# Patient Record
Sex: Female | Born: 1965 | Race: White | Hispanic: No | Marital: Married | Smoking: Former smoker
Health system: Southern US, Community
[De-identification: ages and names within clinical notes are randomized; demographics above are authoritative.]

## PROBLEM LIST (undated history)

## (undated) DIAGNOSIS — F32A Depression, unspecified: Secondary | ICD-10-CM

## (undated) DIAGNOSIS — E063 Autoimmune thyroiditis: Secondary | ICD-10-CM

## (undated) DIAGNOSIS — F419 Anxiety disorder, unspecified: Secondary | ICD-10-CM

## (undated) DIAGNOSIS — N200 Calculus of kidney: Secondary | ICD-10-CM

## (undated) DIAGNOSIS — K3580 Unspecified acute appendicitis: Secondary | ICD-10-CM

## (undated) DIAGNOSIS — K219 Gastro-esophageal reflux disease without esophagitis: Secondary | ICD-10-CM

## (undated) DIAGNOSIS — F329 Major depressive disorder, single episode, unspecified: Secondary | ICD-10-CM

## (undated) HISTORY — DX: Anxiety disorder, unspecified: F41.9

## (undated) HISTORY — DX: Major depressive disorder, single episode, unspecified: F32.9

## (undated) HISTORY — DX: Unspecified acute appendicitis: K35.80

## (undated) HISTORY — DX: Depression, unspecified: F32.A

## (undated) HISTORY — DX: Autoimmune thyroiditis: E06.3

## (undated) HISTORY — PX: BUNIONECTOMY: SHX129

---

## 2015-02-05 DIAGNOSIS — F329 Major depressive disorder, single episode, unspecified: Secondary | ICD-10-CM | POA: Insufficient documentation

## 2015-02-05 DIAGNOSIS — F324 Major depressive disorder, single episode, in partial remission: Secondary | ICD-10-CM | POA: Insufficient documentation

## 2015-02-05 DIAGNOSIS — F32A Depression, unspecified: Secondary | ICD-10-CM | POA: Insufficient documentation

## 2017-08-27 DIAGNOSIS — F411 Generalized anxiety disorder: Secondary | ICD-10-CM | POA: Diagnosis not present

## 2017-09-03 DIAGNOSIS — Z1231 Encounter for screening mammogram for malignant neoplasm of breast: Secondary | ICD-10-CM | POA: Diagnosis not present

## 2017-09-21 ENCOUNTER — Encounter: Payer: Self-pay | Admitting: Internal Medicine

## 2017-09-21 DIAGNOSIS — Z6831 Body mass index (BMI) 31.0-31.9, adult: Secondary | ICD-10-CM | POA: Insufficient documentation

## 2017-09-21 DIAGNOSIS — R7989 Other specified abnormal findings of blood chemistry: Secondary | ICD-10-CM | POA: Insufficient documentation

## 2017-09-21 DIAGNOSIS — Z87442 Personal history of urinary calculi: Secondary | ICD-10-CM | POA: Insufficient documentation

## 2017-09-21 DIAGNOSIS — E063 Autoimmune thyroiditis: Secondary | ICD-10-CM

## 2017-09-22 ENCOUNTER — Encounter: Payer: Self-pay | Admitting: Internal Medicine

## 2017-09-22 ENCOUNTER — Ambulatory Visit (INDEPENDENT_AMBULATORY_CARE_PROVIDER_SITE_OTHER): Payer: 59 | Admitting: Internal Medicine

## 2017-09-22 VITALS — BP 138/88 | HR 85 | Ht 62.0 in | Wt 174.0 lb

## 2017-09-22 DIAGNOSIS — E063 Autoimmune thyroiditis: Secondary | ICD-10-CM

## 2017-09-22 DIAGNOSIS — F32A Depression, unspecified: Secondary | ICD-10-CM

## 2017-09-22 DIAGNOSIS — F329 Major depressive disorder, single episode, unspecified: Secondary | ICD-10-CM | POA: Diagnosis not present

## 2017-09-22 DIAGNOSIS — F419 Anxiety disorder, unspecified: Secondary | ICD-10-CM

## 2017-09-22 NOTE — Progress Notes (Signed)
Date:  09/22/2017   Name:  Tamara Fox   DOB:  [birth year]   MRN:  [id number]   Chief Complaint: Establish Care (Needs PCP. ) and Hypothyroidism (Thyroid levels checked. )  Thyroid Problem  Presents for follow-up visit. Symptoms include depressed mood. Patient reports no fatigue, heat intolerance, leg swelling, menstrual problem, palpitations or weight gain. The symptoms have been stable.  Depression       The patient presents with depression.  This is a chronic problem.  The problem has been gradually improving since onset.  Associated symptoms include no fatigue and no suicidal ideas.  Past treatments include SSRIs - Selective serotonin reuptake inhibitors and other medications.  Compliance with treatment is good.  Past medical history includes thyroid problem, anxiety, depression and suicide attempts.   She has a therapist that she sees regularly and a Therapist, sports with [employer].    Review of Systems  Constitutional: Negative for chills, fatigue, fever and weight gain.  Respiratory: Negative for cough, chest tightness, shortness of breath and wheezing.   Cardiovascular: Negative for chest pain and palpitations.  Endocrine: Negative for heat intolerance.  Genitourinary: Negative for menstrual problem.  Allergic/Immunologic: Negative for environmental allergies.  Psychiatric/Behavioral: Positive for depression. Negative for suicidal ideas.    Patient Active Problem List   Diagnosis Date Noted  . Family history of heart attack 09/21/2017  . Family history of high cholesterol 09/21/2017  . Hashimoto's thyroiditis 09/21/2017  . History of bunionectomy of right great toe 09/21/2017  . History of renal stone 09/21/2017  . History of suicide attempt 09/21/2017  . Overweight 09/21/2017  . Anxiety and depression 02/05/2015    Prior to Admission medications   Medication Sig Start Date End Date Taking? Authorizing Provider  b complex vitamins capsule Take 1 capsule by mouth  daily.   Yes [provider]  busPIRone (BUSPAR) 10 MG tablet Take 10 mg by mouth 2 (two) times daily.   Yes [provider]  Cholecalciferol (VITAMIN D) 2000 units CAPS    Yes [provider]  DULoxetine (CYMBALTA) 60 MG capsule Take 60 mg by mouth daily.   Yes [provider]  etonogestrel-ethinyl estradiol (NUVARING) 0.12-0.015 MG/24HR vaginal ring Place 1 each vaginally every 28 (twenty-eight) days. Insert vaginally and leave in place for 3 consecutive weeks, then remove for 1 week.   Yes [provider]  glucosamine-chondroitin 500-400 MG tablet Take 1 tablet by mouth 3 (three) times daily.   Yes [provider]  hydrOXYzine (ATARAX/VISTARIL) 25 MG tablet Take 50 mg by mouth. 12/14/15  Yes [provider]  levothyroxine (SYNTHROID, LEVOTHROID) 125 MCG tablet levothyroxine 125 mcg tablet   Yes [provider]  Omega-3 Fatty Acids (FISH OIL PO) Take by mouth.   Yes [provider]  RaNITidine HCl (RANITIDINE 150 MAX STRENGTH PO) Take 150 mg by mouth daily.    Yes [provider]    No Known Allergies  Past Surgical History:  Procedure Laterality Date  . BUNIONECTOMY  2013 2017   RightX 2    Social History   Tobacco Use  . Smoking status: Former Smoker    Packs/day: 0.50    Years: 20.00    Pack years: 10.00    Last attempt to quit: 09/30/2002    Years since quitting: 14.9  . Smokeless tobacco: Never Used  Substance Use Topics  . Alcohol use: Yes    Comment: 2 times weekly  . Drug use: Never  Medication list has been reviewed and updated.  No flowsheet data found.  Physical Exam  Constitutional: She is oriented to person, place, and time. She appears well-developed. No distress.  HENT:  Head: Normocephalic and atraumatic.  Neck: Normal range of motion. Neck supple. No thyromegaly present.  Cardiovascular: Normal rate, regular rhythm and normal heart sounds.  Pulmonary/Chest:  Effort normal and breath sounds normal. No respiratory distress. She has no wheezes.  Musculoskeletal: Normal range of motion. She exhibits no edema or tenderness.  Neurological: She is alert and oriented to person, place, and time.  Skin: Skin is warm and dry. No rash noted.  Psychiatric: She has a normal mood and affect. Her speech is normal and behavior is normal. Thought content normal.  Nursing note and vitals reviewed.   BP 138/88   Pulse 85   Ht 5\' 2"  (1.575 m)   Wt 174 lb (78.9 kg)   LMP 09/08/2017 (Within Days)   SpO2 98%   BMI 31.83 kg/m   Assessment and Plan: 1. Hashimoto's disease Continue current medication - Thyroid Panel With TSH  2. Anxiety and depression Doing well - continue current medication and follow up with mental health providers   No orders of the defined types were placed in this encounter.   Partially dictated using Animal nutritionistDragon software. Any errors are unintentional.  Bari EdwardLaura Female Iafrate, MD [facility]  09/22/2017

## 2017-09-23 LAB — THYROID PANEL WITH TSH
FREE THYROXINE INDEX: 1.6 (ref 1.2–4.9)
T3 UPTAKE RATIO: 19 % — AB (ref 24–39)
T4, Total: 8.5 ug/dL (ref 4.5–12.0)
TSH: 1.36 u[IU]/mL (ref 0.450–4.500)

## 2017-12-18 ENCOUNTER — Encounter: Payer: Self-pay | Admitting: Internal Medicine

## 2017-12-22 ENCOUNTER — Other Ambulatory Visit: Payer: Self-pay | Admitting: Internal Medicine

## 2017-12-22 DIAGNOSIS — F32A Depression, unspecified: Secondary | ICD-10-CM

## 2017-12-22 DIAGNOSIS — F419 Anxiety disorder, unspecified: Principal | ICD-10-CM

## 2017-12-22 DIAGNOSIS — F329 Major depressive disorder, single episode, unspecified: Secondary | ICD-10-CM

## 2018-01-12 DIAGNOSIS — Z79899 Other long term (current) drug therapy: Secondary | ICD-10-CM | POA: Diagnosis not present

## 2018-01-12 DIAGNOSIS — F330 Major depressive disorder, recurrent, mild: Secondary | ICD-10-CM | POA: Diagnosis not present

## 2018-01-12 DIAGNOSIS — F411 Generalized anxiety disorder: Secondary | ICD-10-CM | POA: Diagnosis not present

## 2018-03-23 ENCOUNTER — Encounter: Payer: Self-pay | Admitting: Internal Medicine

## 2018-03-23 ENCOUNTER — Other Ambulatory Visit: Payer: Self-pay

## 2018-03-23 MED ORDER — LEVOTHYROXINE SODIUM 125 MCG PO TABS: ORAL_TABLET | ORAL | 0 refills | Status: DC

## 2018-03-29 ENCOUNTER — Encounter: Payer: Self-pay | Admitting: Internal Medicine

## 2018-03-30 ENCOUNTER — Other Ambulatory Visit (HOSPITAL_COMMUNITY)
Admission: RE | Admit: 2018-03-30 | Discharge: 2018-03-30 | Disposition: A | Discharge status: Home / Self Care | Payer: 59 | Source: Ambulatory Visit | Attending: Internal Medicine | Admitting: Internal Medicine

## 2018-03-30 ENCOUNTER — Encounter: Payer: Self-pay | Admitting: Internal Medicine

## 2018-03-30 ENCOUNTER — Ambulatory Visit (INDEPENDENT_AMBULATORY_CARE_PROVIDER_SITE_OTHER): Payer: 59 | Admitting: Internal Medicine

## 2018-03-30 ENCOUNTER — Other Ambulatory Visit: Payer: Self-pay

## 2018-03-30 VITALS — BP 128/84 | HR 73 | Ht 62.0 in | Wt 170.0 lb

## 2018-03-30 DIAGNOSIS — F324 Major depressive disorder, single episode, in partial remission: Secondary | ICD-10-CM | POA: Diagnosis not present

## 2018-03-30 DIAGNOSIS — E063 Autoimmune thyroiditis: Secondary | ICD-10-CM | POA: Diagnosis not present

## 2018-03-30 DIAGNOSIS — Z124 Encounter for screening for malignant neoplasm of cervix: Secondary | ICD-10-CM

## 2018-03-30 DIAGNOSIS — Z1239 Encounter for other screening for malignant neoplasm of breast: Secondary | ICD-10-CM

## 2018-03-30 DIAGNOSIS — Z1211 Encounter for screening for malignant neoplasm of colon: Secondary | ICD-10-CM

## 2018-03-30 DIAGNOSIS — Z0000 Encounter for general adult medical examination without abnormal findings: Secondary | ICD-10-CM

## 2018-03-30 LAB — POCT URINALYSIS DIPSTICK
Bilirubin, UA: NEGATIVE
Blood, UA: NEGATIVE
GLUCOSE UA: NEGATIVE
Ketones, UA: NEGATIVE
LEUKOCYTES UA: NEGATIVE
Nitrite, UA: NEGATIVE
Protein, UA: NEGATIVE
SPEC GRAV UA: 1.015 (ref 1.010–1.025)
Urobilinogen, UA: 0.2 E.U./dL
pH, UA: 6 (ref 5.0–8.0)

## 2018-03-30 NOTE — Progress Notes (Signed)
Date:  03/30/2018   Name:  Venie Montesinos   DOB:  07/02/1965   MRN:  [id number]   Chief Complaint: Annual Exam (Breast Exam.) Ezelle Surprenant is a 52 y.o. female who presents today for her Complete Annual Exam. She feels well. She reports exercising walking at work. She reports she is sleeping well. No breast issues.  Still having periods but very light.  Mild hot flashes.  Mammogram done in march.   Thyroid Problem  Presents for follow-up visit. Patient reports no anxiety, constipation, depressed mood, diarrhea, fatigue, palpitations, tremors, weight gain or weight loss. The symptoms have been stable.  Depression         This is a chronic problem.  Associated symptoms include no fatigue and no headaches.  Past treatments include SNRIs - Serotonin and norepinephrine reuptake inhibitors and other medications.  Compliance with treatment is good.  Previous treatment provided significant relief.  Past medical history includes thyroid problem.     Review of Systems  Constitutional: Negative for chills, fatigue, fever, weight gain and weight loss.  HENT: Negative for congestion, hearing loss, tinnitus, trouble swallowing and voice change.   Eyes: Negative for visual disturbance.  Respiratory: Negative for cough, chest tightness, shortness of breath and wheezing.   Cardiovascular: Negative for chest pain, palpitations and leg swelling.  Gastrointestinal: Negative for abdominal pain, constipation, diarrhea and vomiting.  Endocrine: Negative for polydipsia and polyuria.  Genitourinary: Negative for dysuria, frequency, genital sores and vaginal discharge.  Musculoskeletal: Negative for arthralgias, gait problem and joint swelling.  Skin: Negative for color change and rash.  Neurological: Negative for dizziness, tremors, light-headedness and headaches.  Hematological: Negative for adenopathy. Does not bruise/bleed easily.  Psychiatric/Behavioral: Positive for depression. Negative for dysphoric mood  and sleep disturbance. The patient is not nervous/anxious.     Patient Active Problem List   Diagnosis Date Noted  . High serum high density lipoprotein (HDL) 09/21/2017  . Hashimoto's thyroiditis 09/21/2017  . History of renal stone 09/21/2017  . BMI 31.0-31.9,adult 09/21/2017  . 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 BUNIONECTOMY  2013 2017   RightX 2    Social History   Tobacco Use  . Smoking status: Former Smoker    Packs/day: 0.50    Years: 20.00    Pack years: 10.00    Last attempt to quit: 09/30/2002    Years since quitting: 15.5  . Smokeless tobacco: Never Used  Substance Use Topics  . Alcohol use: Yes    Comment: 2 times weekly  . Drug use: Never     Medication list has been reviewed and updated.  Current Meds  Medication Sig  . b complex vitamins capsule Take 1 capsule by mouth daily.  . busPIRone (BUSPAR) 10 MG tablet Take 10 mg by mouth 2 (two) times daily.  . Cholecalciferol (VITAMIN D) 2000 units CAPS   . DULoxetine (CYMBALTA) 60 MG capsule Take 60 mg by mouth daily.  . hydrOXYzine (ATARAX/VISTARIL) 25 MG tablet Take 50 mg by mouth.  . levothyroxine (SYNTHROID, LEVOTHROID) 125 MCG tablet Take 1 tablet by mouth daily.  . Omega-3 Fatty Acids (FISH OIL PO) Take by mouth.  . RaNITidine HCl (RANITIDINE 150 MAX STRENGTH PO) Take 150 mg by mouth daily.     PHQ 2/9 Scores 03/30/2018  PHQ - 2 Score 0  PHQ- 9 Score 0    Physical Exam  Constitutional: She is oriented to person, place, and time. She appears well-developed and well-nourished. No distress.  HENT:  Head: Normocephalic and atraumatic.  Right Ear: Tympanic membrane and ear canal normal.  Left Ear: Tympanic membrane and ear canal normal.  Nose: Right sinus exhibits no maxillary sinus tenderness. Left sinus exhibits no maxillary sinus tenderness.  Mouth/Throat: Uvula is midline and  oropharynx is clear and moist.  Eyes: Conjunctivae and EOM are normal. Right eye exhibits no discharge. Left eye exhibits no discharge. No scleral icterus.  Neck: Normal range of motion. Carotid bruit is not present. No erythema present. No thyromegaly present.  Cardiovascular: Normal rate, regular rhythm, normal heart sounds and normal pulses.  Pulmonary/Chest: Effort normal. No respiratory distress. She has no wheezes. Right breast exhibits no mass, no nipple discharge, no skin change and no tenderness. Left breast exhibits no mass, no nipple discharge, no skin change and no tenderness.  Abdominal: Soft. Bowel sounds are normal. There is no hepatosplenomegaly. There is no tenderness. There is no CVA tenderness.  Genitourinary: Rectum normal and uterus normal. There is no rash, tenderness or lesion on the right labia. There is no rash, tenderness or lesion on the left labia. Cervix exhibits discharge. Cervix exhibits no motion tenderness and no friability. Right adnexum displays no mass, no tenderness and no fullness. Left adnexum displays no mass, no tenderness and no fullness.  Musculoskeletal: Normal range of motion.  Lymphadenopathy:    She has no cervical adenopathy.    She has no axillary adenopathy.  Neurological: She is alert and oriented to person, place, and time. She has normal reflexes. No cranial nerve deficit or sensory deficit.  Skin: Skin is warm, dry and intact. No rash noted.  Psychiatric: She has a normal mood and affect. Her speech is normal and behavior is normal. Thought content normal.  Nursing note and vitals reviewed.   BP (!) 142/82 (BP Location: Right Arm, Patient Position: Sitting, Cuff Size: Normal)   Pulse 73   Ht 5\' 2"  (1.575 m)   Wt 170 lb (77.1 kg)   LMP  (Exact Date) Comment: unkown  SpO2 100%   BMI 31.09 kg/m   Assessment and Plan: 1. Annual physical exam Normal exam except weight  Begin regular exercise - CBC with Differential/Platelet -  Comprehensive metabolic panel - Hemoglobin A1c - Lipid panel - POCT urinalysis dipstick  2. Breast cancer screening Pt to schedule in March  3. Papanicolaou smear for cervical cancer screening Obtained today - Cytology - PAP  4. Hashimoto's thyroiditis Check labs then refill meds - Thyroid Panel With TSH  5. Major depression single episode, in partial remission (HCC) Doing well on current therapy Followed by Psych  6. Colon cancer screening - Ambulatory referral to Gastroenterology   Partially dictated using Dragon software. Any errors are unintentional.  Bari Edward, MD [facility]  03/30/2018

## 2018-03-31 ENCOUNTER — Other Ambulatory Visit: Payer: Self-pay

## 2018-03-31 ENCOUNTER — Encounter: Payer: Self-pay | Admitting: Internal Medicine

## 2018-03-31 LAB — HEMOGLOBIN A1C
Est. average glucose Bld gHb Est-mCnc: 91 mg/dL
HEMOGLOBIN A1C: 4.8 % (ref 4.8–5.6)

## 2018-03-31 LAB — COMPREHENSIVE METABOLIC PANEL
A/G RATIO: 2.3 — AB (ref 1.2–2.2)
ALK PHOS: 80 IU/L (ref 39–117)
ALT: 35 IU/L — AB (ref 0–32)
AST: 28 IU/L (ref 0–40)
Albumin: 4.9 g/dL (ref 3.5–5.5)
BILIRUBIN TOTAL: 0.4 mg/dL (ref 0.0–1.2)
BUN/Creatinine Ratio: 15 (ref 9–23)
BUN: 12 mg/dL (ref 6–24)
CHLORIDE: 98 mmol/L (ref 96–106)
CO2: 25 mmol/L (ref 20–29)
Calcium: 10 mg/dL (ref 8.7–10.2)
Creatinine, Ser: 0.8 mg/dL (ref 0.57–1.00)
GFR calc non Af Amer: 86 mL/min/{1.73_m2} (ref >59)
GFR, EST AFRICAN AMERICAN: 99 mL/min/{1.73_m2} (ref >59)
GLUCOSE: 70 mg/dL (ref 65–99)
Globulin, Total: 2.1 g/dL (ref 1.5–4.5)
POTASSIUM: 4.4 mmol/L (ref 3.5–5.2)
Sodium: 140 mmol/L (ref 134–144)
Total Protein: 7 g/dL (ref 6.0–8.5)

## 2018-03-31 LAB — CBC WITH DIFFERENTIAL/PLATELET
BASOS: 1 %
Basophils Absolute: 0.1 10*3/uL (ref 0.0–0.2)
EOS (ABSOLUTE): 0.1 10*3/uL (ref 0.0–0.4)
Eos: 2 %
HEMOGLOBIN: 14.1 g/dL (ref 11.1–15.9)
Hematocrit: 43 % (ref 34.0–46.6)
IMMATURE GRANS (ABS): 0 10*3/uL (ref 0.0–0.1)
Immature Granulocytes: 0 %
LYMPHS ABS: 2.5 10*3/uL (ref 0.7–3.1)
LYMPHS: 41 %
MCH: 30.2 pg (ref 26.6–33.0)
MCHC: 32.8 g/dL (ref 31.5–35.7)
MCV: 92 fL (ref 79–97)
MONOCYTES: 10 %
Monocytes Absolute: 0.6 10*3/uL (ref 0.1–0.9)
NEUTROS ABS: 2.8 10*3/uL (ref 1.4–7.0)
Neutrophils: 46 %
PLATELETS: 304 10*3/uL (ref 150–450)
RBC: 4.67 x10E6/uL (ref 3.77–5.28)
RDW: 12.2 % — AB (ref 12.3–15.4)
WBC: 6.1 10*3/uL (ref 3.4–10.8)

## 2018-03-31 LAB — LIPID PANEL
CHOL/HDL RATIO: 2.7 ratio (ref 0.0–4.4)
Cholesterol, Total: 207 mg/dL — ABNORMAL HIGH (ref 100–199)
HDL: 78 mg/dL (ref >39)
LDL CALC: 112 mg/dL — AB (ref 0–99)
TRIGLYCERIDES: 83 mg/dL (ref 0–149)
VLDL Cholesterol Cal: 17 mg/dL (ref 5–40)

## 2018-03-31 LAB — CYTOLOGY - PAP
Adequacy: ABSENT
Diagnosis: NEGATIVE

## 2018-03-31 LAB — THYROID PANEL WITH TSH
Free Thyroxine Index: 2.1 (ref 1.2–4.9)
T3 UPTAKE RATIO: 27 % (ref 24–39)
T4 TOTAL: 7.8 ug/dL (ref 4.5–12.0)
TSH: 0.558 u[IU]/mL (ref 0.450–4.500)

## 2018-03-31 MED ORDER — LEVOTHYROXINE SODIUM 125 MCG PO TABS: ORAL_TABLET | ORAL | 1 refills | Status: DC

## 2018-04-13 DIAGNOSIS — Z79899 Other long term (current) drug therapy: Secondary | ICD-10-CM | POA: Diagnosis not present

## 2018-04-13 DIAGNOSIS — F411 Generalized anxiety disorder: Secondary | ICD-10-CM | POA: Diagnosis not present

## 2018-04-13 DIAGNOSIS — F330 Major depressive disorder, recurrent, mild: Secondary | ICD-10-CM | POA: Diagnosis not present

## 2018-04-28 NOTE — Anesthesia Preprocedure Evaluation (Addendum)
Anesthesia Evaluation  Patient identified by MRN, date of birth, ID band Patient awake    Reviewed: Allergy & Precautions, NPO status , Patient's Chart, lab work & pertinent test results  History of Anesthesia Complications Negative for: history of anesthetic complications  Airway Mallampati: II  TM Distance: >3 FB Neck ROM: Full    Dental  (+)    Pulmonary former smoker (quit 2004),  Snoring    Pulmonary exam normal breath sounds clear to auscultation       Cardiovascular Exercise Tolerance: Good negative cardio ROS Normal cardiovascular exam Rhythm:Regular Rate:Normal     Neuro/Psych PSYCHIATRIC DISORDERS Anxiety Depression negative neurological ROS     GI/Hepatic GERD  ,  Endo/Other  Hypothyroidism (Hashimoto)   Renal/GU Renal disease (hx nephrolithiasis)     Musculoskeletal   Abdominal   Peds  Hematology negative hematology ROS (+)   Anesthesia Other Findings   Reproductive/Obstetrics                            Anesthesia Physical Anesthesia Plan  ASA: II  Anesthesia Plan: General   Post-op Pain Management:    Induction: Intravenous  PONV Risk Score and Plan: 3 and Propofol infusion and TIVA  Airway Management Planned: Natural Airway  Additional Equipment:   Intra-op Plan:   Post-operative Plan:   Informed Consent: I have reviewed the patients History and Physical, chart, labs and discussed the procedure including the risks, benefits and alternatives for the proposed anesthesia with the patient or authorized representative who has indicated his/her understanding and acceptance.     Plan Discussed with: CRNA  Anesthesia Plan Comments:        Anesthesia Quick Evaluation

## 2018-05-03 ENCOUNTER — Other Ambulatory Visit: Payer: Self-pay

## 2018-05-03 ENCOUNTER — Encounter: Payer: Self-pay | Admitting: *Deleted

## 2018-05-04 NOTE — Discharge Instructions (Signed)
General Anesthesia, Adult, Care After °These instructions provide you with information about caring for yourself after your procedure. Your health care provider may also give you more specific instructions. Your treatment has been planned according to current medical practices, but problems sometimes occur. Call your health care provider if you have any problems or questions after your procedure. °What can I expect after the procedure? °After the procedure, it is common to have: °· Vomiting. °· A sore throat. °· Mental slowness. ° °It is common to feel: °· Nauseous. °· Cold or shivery. °· Sleepy. °· Tired. °· Sore or achy, even in parts of your body where you did not have surgery. ° °Follow these instructions at home: °For at least 24 hours after the procedure: °· Do not: °? Participate in activities where you could fall or become injured. °? Drive. °? Use heavy machinery. °? Drink alcohol. °? Take sleeping pills or medicines that cause drowsiness. °? Make important decisions or sign legal documents. °? Take care of children on your own. °· Rest. °Eating and drinking °· If you vomit, drink water, juice, or soup when you can drink without vomiting. °· Drink enough fluid to keep your urine clear or pale yellow. °· Make sure you have little or no nausea before eating solid foods. °· Follow the diet recommended by your health care provider. °General instructions °· Have a responsible adult stay with you until you are awake and alert. °· Return to your normal activities as told by your health care provider. Ask your health care provider what activities are safe for you. °· Take over-the-counter and prescription medicines only as told by your health care provider. °· If you smoke, do not smoke without supervision. °· Keep all follow-up visits as told by your health care provider. This is important. °Contact a health care provider if: °· You continue to have nausea or vomiting at home, and medicines are not helpful. °· You  cannot drink fluids or start eating again. °· You cannot urinate after 8-12 hours. °· You develop a skin rash. °· You have fever. °· You have increasing redness at the site of your procedure. °Get help right away if: °· You have difficulty breathing. °· You have chest pain. °· You have unexpected bleeding. °· You feel that you are having a life-threatening or urgent problem. °This information is not intended to replace advice given to you by your health care provider. Make sure you discuss any questions you have with your health care provider. °Document Released: 09/22/2000 Document Revised: 11/19/2015 Document Reviewed: 05/31/2015 °[employer]vier Interactive Patient Education © 2018 [employer] ° °

## 2018-05-05 ENCOUNTER — Other Ambulatory Visit: Payer: Self-pay

## 2018-05-05 ENCOUNTER — Other Ambulatory Visit: Payer: Self-pay | Admitting: Gastroenterology

## 2018-05-05 ENCOUNTER — Telehealth: Payer: Self-pay | Admitting: Gastroenterology

## 2018-05-05 ENCOUNTER — Encounter: Payer: Self-pay | Admitting: Emergency Medicine

## 2018-05-05 ENCOUNTER — Ambulatory Visit: Payer: 59 | Admitting: Anesthesiology

## 2018-05-05 ENCOUNTER — Encounter
Admission: RE | Disposition: A | Discharge status: Home / Self Care | Payer: Self-pay | Source: Ambulatory Visit | Attending: Gastroenterology

## 2018-05-05 ENCOUNTER — Observation Stay
Admission: EM | Admit: 2018-05-05 | Discharge: 2018-05-06 | Disposition: A | Discharge status: Home / Self Care | Payer: 59 | Attending: Surgery | Admitting: Surgery

## 2018-05-05 ENCOUNTER — Ambulatory Visit (AMBULATORY_SURGERY_CENTER)
Admission: RE | Admit: 2018-05-05 | Discharge: 2018-05-05 | Disposition: A | Discharge status: Home / Self Care | Payer: 59 | Source: Ambulatory Visit | Attending: Gastroenterology | Admitting: Gastroenterology

## 2018-05-05 ENCOUNTER — Encounter
Admission: EM | Disposition: A | Discharge status: Home / Self Care | Payer: Self-pay | Source: Home / Self Care | Attending: Student in an Organized Health Care Education/Training Program

## 2018-05-05 ENCOUNTER — Ambulatory Visit
Admission: RE | Admit: 2018-05-05 | Discharge: 2018-05-05 | Disposition: A | Discharge status: Home / Self Care | Payer: 59 | Source: Ambulatory Visit | Attending: Gastroenterology | Admitting: Gastroenterology

## 2018-05-05 DIAGNOSIS — E063 Autoimmune thyroiditis: Secondary | ICD-10-CM | POA: Insufficient documentation

## 2018-05-05 DIAGNOSIS — K388 Other specified diseases of appendix: Secondary | ICD-10-CM | POA: Diagnosis not present

## 2018-05-05 DIAGNOSIS — Z87891 Personal history of nicotine dependence: Secondary | ICD-10-CM

## 2018-05-05 DIAGNOSIS — F329 Major depressive disorder, single episode, unspecified: Secondary | ICD-10-CM | POA: Insufficient documentation

## 2018-05-05 DIAGNOSIS — K219 Gastro-esophageal reflux disease without esophagitis: Secondary | ICD-10-CM | POA: Insufficient documentation

## 2018-05-05 DIAGNOSIS — K36 Other appendicitis: Secondary | ICD-10-CM

## 2018-05-05 DIAGNOSIS — K3580 Unspecified acute appendicitis: Secondary | ICD-10-CM | POA: Diagnosis present

## 2018-05-05 DIAGNOSIS — F419 Anxiety disorder, unspecified: Secondary | ICD-10-CM | POA: Insufficient documentation

## 2018-05-05 DIAGNOSIS — Z79890 Hormone replacement therapy (postmenopausal): Secondary | ICD-10-CM | POA: Insufficient documentation

## 2018-05-05 DIAGNOSIS — K37 Unspecified appendicitis: Secondary | ICD-10-CM

## 2018-05-05 DIAGNOSIS — N200 Calculus of kidney: Secondary | ICD-10-CM | POA: Insufficient documentation

## 2018-05-05 DIAGNOSIS — Z1211 Encounter for screening for malignant neoplasm of colon: Secondary | ICD-10-CM

## 2018-05-05 DIAGNOSIS — Z6831 Body mass index (BMI) 31.0-31.9, adult: Secondary | ICD-10-CM | POA: Diagnosis not present

## 2018-05-05 DIAGNOSIS — Z79899 Other long term (current) drug therapy: Secondary | ICD-10-CM | POA: Insufficient documentation

## 2018-05-05 DIAGNOSIS — E669 Obesity, unspecified: Secondary | ICD-10-CM | POA: Diagnosis not present

## 2018-05-05 DIAGNOSIS — F411 Generalized anxiety disorder: Secondary | ICD-10-CM | POA: Insufficient documentation

## 2018-05-05 DIAGNOSIS — F324 Major depressive disorder, single episode, in partial remission: Secondary | ICD-10-CM | POA: Insufficient documentation

## 2018-05-05 DIAGNOSIS — K3589 Other acute appendicitis without perforation or gangrene: Secondary | ICD-10-CM | POA: Diagnosis not present

## 2018-05-05 DIAGNOSIS — K760 Fatty (change of) liver, not elsewhere classified: Secondary | ICD-10-CM

## 2018-05-05 DIAGNOSIS — K8020 Calculus of gallbladder without cholecystitis without obstruction: Secondary | ICD-10-CM

## 2018-05-05 HISTORY — PX: LAPAROSCOPIC APPENDECTOMY: SHX408

## 2018-05-05 HISTORY — DX: Unspecified acute appendicitis: K35.80

## 2018-05-05 HISTORY — DX: Calculus of kidney: N20.0

## 2018-05-05 HISTORY — DX: Gastro-esophageal reflux disease without esophagitis: K21.9

## 2018-05-05 HISTORY — PX: COLONOSCOPY WITH PROPOFOL: SHX5780

## 2018-05-05 LAB — POC URINE PREG, ED: PREG TEST UR: NEGATIVE

## 2018-05-05 LAB — ABO/RH: ABO/RH(D): O POS

## 2018-05-05 LAB — COMPREHENSIVE METABOLIC PANEL
ALBUMIN: 4 g/dL (ref 3.5–5.0)
ALK PHOS: 58 U/L (ref 38–126)
ALT: 35 U/L (ref 0–44)
ANION GAP: 8 (ref 5–15)
AST: 32 U/L (ref 15–41)
BUN: 12 mg/dL (ref 6–20)
CALCIUM: 9.4 mg/dL (ref 8.9–10.3)
CHLORIDE: 105 mmol/L (ref 98–111)
CO2: 28 mmol/L (ref 22–32)
Creatinine, Ser: 0.85 mg/dL (ref 0.44–1.00)
GFR calc Af Amer: >60 mL/min (ref >60)
GFR calc non Af Amer: >60 mL/min (ref >60)
GLUCOSE: 84 mg/dL (ref 70–99)
Potassium: 3.7 mmol/L (ref 3.5–5.1)
SODIUM: 141 mmol/L (ref 135–145)
Total Bilirubin: 0.6 mg/dL (ref 0.3–1.2)
Total Protein: 7 g/dL (ref 6.5–8.1)

## 2018-05-05 LAB — CBC WITH DIFFERENTIAL/PLATELET
Abs Immature Granulocytes: 0.01 10*3/uL (ref 0.00–0.07)
BASOS ABS: 0.1 10*3/uL (ref 0.0–0.1)
BASOS PCT: 1 %
Eosinophils Absolute: 0.1 10*3/uL (ref 0.0–0.5)
Eosinophils Relative: 1 %
HCT: 38.5 % (ref 36.0–46.0)
Hemoglobin: 12.8 g/dL (ref 12.0–15.0)
Immature Granulocytes: 0 %
Lymphocytes Relative: 37 %
Lymphs Abs: 2.2 10*3/uL (ref 0.7–4.0)
MCH: 30.2 pg (ref 26.0–34.0)
MCHC: 33.2 g/dL (ref 30.0–36.0)
MCV: 90.8 fL (ref 80.0–100.0)
Monocytes Absolute: 0.6 10*3/uL (ref 0.1–1.0)
Monocytes Relative: 10 %
NEUTROS ABS: 2.9 10*3/uL (ref 1.7–7.7)
NEUTROS PCT: 51 %
NRBC: 0 % (ref 0.0–0.2)
PLATELETS: 248 10*3/uL (ref 150–400)
RBC: 4.24 MIL/uL (ref 3.87–5.11)
RDW: 12.5 % (ref 11.5–15.5)
WBC: 5.8 10*3/uL (ref 4.0–10.5)

## 2018-05-05 LAB — SURGICAL PCR SCREEN
MRSA, PCR: NEGATIVE
STAPHYLOCOCCUS AUREUS: POSITIVE — AB

## 2018-05-05 SURGERY — APPENDECTOMY, LAPAROSCOPIC
Anesthesia: General | Site: Abdomen

## 2018-05-05 SURGERY — COLONOSCOPY WITH PROPOFOL
Anesthesia: General | Site: Rectum

## 2018-05-05 MED ORDER — ACETAMINOPHEN 325 MG PO TABS: 650.0000 mg | ORAL_TABLET | Freq: Once | ORAL | Status: DC | PRN

## 2018-05-05 MED ORDER — FENTANYL CITRATE (PF) 100 MCG/2ML IJ SOLN
INTRAMUSCULAR | Status: AC
  Filled 2018-05-05: qty 2

## 2018-05-05 MED ORDER — PROPOFOL 10 MG/ML IV BOLUS
INTRAVENOUS | Status: AC
  Filled 2018-05-05: qty 20

## 2018-05-05 MED ORDER — SODIUM CHLORIDE 0.9 % IV SOLN
2.0000 g | INTRAVENOUS | Status: DC
  Administered 2018-05-06: 2 g via INTRAVENOUS
  Filled 2018-05-05: qty 20

## 2018-05-05 MED ORDER — PIPERACILLIN-TAZOBACTAM 3.375 G IVPB 30 MIN
3.3750 g | Freq: Once | INTRAVENOUS | Status: AC
  Administered 2018-05-05: 3.375 g via INTRAVENOUS
  Filled 2018-05-05: qty 50

## 2018-05-05 MED ORDER — METOPROLOL TARTRATE 5 MG/5ML IV SOLN: 5.0000 mg | Freq: Four times a day (QID) | INTRAVENOUS | Status: DC | PRN

## 2018-05-05 MED ORDER — PHENYLEPHRINE HCL 10 MG/ML IJ SOLN
INTRAMUSCULAR | Status: AC
  Filled 2018-05-05: qty 1

## 2018-05-05 MED ORDER — STERILE WATER FOR IRRIGATION IR SOLN
Status: DC | PRN
  Administered 2018-05-05: 09:00:00

## 2018-05-05 MED ORDER — LACTATED RINGERS IV SOLN
INTRAVENOUS | Status: DC
  Administered 2018-05-05: 07:00:00 via INTRAVENOUS

## 2018-05-05 MED ORDER — LIDOCAINE HCL (CARDIAC) PF 100 MG/5ML IV SOSY
PREFILLED_SYRINGE | INTRAVENOUS | Status: DC | PRN
  Administered 2018-05-05: 40 mg via INTRAVENOUS

## 2018-05-05 MED ORDER — IOPAMIDOL (ISOVUE-300) INJECTION 61%
100.0000 mL | Freq: Once | INTRAVENOUS | Status: AC | PRN
  Administered 2018-05-05: 100 mL via INTRAVENOUS

## 2018-05-05 MED ORDER — ONDANSETRON HCL 4 MG/2ML IJ SOLN: 4.0000 mg | Freq: Once | INTRAMUSCULAR | Status: DC | PRN

## 2018-05-05 MED ORDER — ENOXAPARIN SODIUM 40 MG/0.4ML ~~LOC~~ SOLN: 40.0000 mg | SUBCUTANEOUS | Status: DC

## 2018-05-05 MED ORDER — PROPOFOL 10 MG/ML IV BOLUS
INTRAVENOUS | Status: DC | PRN
  Administered 2018-05-05: 20 mg via INTRAVENOUS
  Administered 2018-05-05: 50 mg via INTRAVENOUS
  Administered 2018-05-05 (×2): 20 mg via INTRAVENOUS
  Administered 2018-05-05 (×2): 30 mg via INTRAVENOUS
  Administered 2018-05-05: 100 mg via INTRAVENOUS
  Administered 2018-05-05: 30 mg via INTRAVENOUS
  Administered 2018-05-05: 20 mg via INTRAVENOUS
  Administered 2018-05-05: 30 mg via INTRAVENOUS

## 2018-05-05 MED ORDER — ONDANSETRON HCL 4 MG/2ML IJ SOLN: 4.0000 mg | Freq: Four times a day (QID) | INTRAMUSCULAR | Status: DC | PRN

## 2018-05-05 MED ORDER — ACETAMINOPHEN 500 MG PO TABS: 1000.0000 mg | ORAL_TABLET | Freq: Four times a day (QID) | ORAL | Status: DC | PRN

## 2018-05-05 MED ORDER — METRONIDAZOLE IN NACL 5-0.79 MG/ML-% IV SOLN
500.0000 mg | Freq: Three times a day (TID) | INTRAVENOUS | Status: DC
  Administered 2018-05-05: 500 mg via INTRAVENOUS
  Filled 2018-05-05 (×3): qty 100

## 2018-05-05 MED ORDER — SODIUM CHLORIDE 0.9 % IV BOLUS
1000.0000 mL | Freq: Once | INTRAVENOUS | Status: AC
  Administered 2018-05-05: 1000 mL via INTRAVENOUS

## 2018-05-05 MED ORDER — MIDAZOLAM HCL 2 MG/2ML IJ SOLN
INTRAMUSCULAR | Status: AC
  Filled 2018-05-05: qty 2

## 2018-05-05 MED ORDER — ACETAMINOPHEN 160 MG/5ML PO SOLN: 325.0000 mg | ORAL | Status: DC | PRN

## 2018-05-05 MED ORDER — MUPIROCIN 2 % EX OINT
1.0000 "application " | TOPICAL_OINTMENT | Freq: Two times a day (BID) | CUTANEOUS | Status: DC
  Administered 2018-05-05 – 2018-05-06 (×2): 1 via NASAL
  Filled 2018-05-05: qty 22

## 2018-05-05 MED ORDER — ONDANSETRON 4 MG PO TBDP: 4.0000 mg | ORAL_TABLET | Freq: Four times a day (QID) | ORAL | Status: DC | PRN

## 2018-05-05 MED ORDER — LACTATED RINGERS IV SOLN
INTRAVENOUS | Status: DC
  Administered 2018-05-05 – 2018-05-06 (×2): via INTRAVENOUS

## 2018-05-05 SURGICAL SUPPLY — 39 items
BLADE SURG SZ11 CARB STEEL (BLADE) ×2 IMPLANT
CANISTER SUCT 3000ML PPV (MISCELLANEOUS) ×2 IMPLANT
CHLORAPREP W/TINT 26ML (MISCELLANEOUS) ×2 IMPLANT
COVER WAND RF STERILE (DRAPES) IMPLANT
CUTTER FLEX LINEAR 45M (STAPLE) ×2 IMPLANT
DECANTER SPIKE VIAL GLASS SM (MISCELLANEOUS) ×2 IMPLANT
DERMABOND ADVANCED (GAUZE/BANDAGES/DRESSINGS) ×1
DERMABOND ADVANCED .7 DNX12 (GAUZE/BANDAGES/DRESSINGS) ×1 IMPLANT
ELECT REM PT RETURN 9FT ADLT (ELECTROSURGICAL) ×2
ELECTRODE REM PT RTRN 9FT ADLT (ELECTROSURGICAL) ×1 IMPLANT
GLOVE BIO SURGEON STRL SZ7 (GLOVE) ×4 IMPLANT
GLOVE BIOGEL PI IND STRL 7.5 (GLOVE) ×3 IMPLANT
GLOVE BIOGEL PI INDICATOR 7.5 (GLOVE) ×3
GOWN STRL REUS W/ TWL LRG LVL4 (GOWN DISPOSABLE) ×1 IMPLANT
GOWN STRL REUS W/ TWL XL LVL3 (GOWN DISPOSABLE) ×1 IMPLANT
GOWN STRL REUS W/TWL LRG LVL4 (GOWN DISPOSABLE) ×1
GOWN STRL REUS W/TWL XL LVL3 (GOWN DISPOSABLE) ×1
GRASPER SUT TROCAR 14GX15 (MISCELLANEOUS) ×2 IMPLANT
IRRIGATION STRYKERFLOW (MISCELLANEOUS) IMPLANT
IRRIGATOR STRYKERFLOW (MISCELLANEOUS)
KIT TURNOVER KIT A (KITS) ×2 IMPLANT
NEEDLE HYPO 22GX1.5 SAFETY (NEEDLE) ×2 IMPLANT
NEEDLE INSUFFLATION 14GA 120MM (NEEDLE) ×2 IMPLANT
NS IRRIG 1000ML POUR BTL (IV SOLUTION) ×2 IMPLANT
PACK LAP CHOLECYSTECTOMY (MISCELLANEOUS) ×2 IMPLANT
POUCH SPECIMEN RETRIEVAL 10MM (ENDOMECHANICALS) ×2 IMPLANT
RELOAD 45 VASCULAR/THIN (ENDOMECHANICALS) IMPLANT
RELOAD STAPLE TA45 3.5 REG BLU (ENDOMECHANICALS) ×2 IMPLANT
SHEARS HARMONIC ACE PLUS 36CM (ENDOMECHANICALS) ×2 IMPLANT
SLEEVE ENDOPATH XCEL 5M (ENDOMECHANICALS) ×2 IMPLANT
SOL .9 NS 3000ML IRR  AL (IV SOLUTION)
SOL .9 NS 3000ML IRR UROMATIC (IV SOLUTION) IMPLANT
SUT MNCRL AB 4-0 PS2 18 (SUTURE) ×2 IMPLANT
SUT VICRYL 0 UR6 27IN ABS (SUTURE) ×2 IMPLANT
SUT VICRYL AB 3-0 FS1 BRD 27IN (SUTURE) ×2 IMPLANT
TRAY FOLEY MTR SLVR 16FR STAT (SET/KITS/TRAYS/PACK) ×2 IMPLANT
TROCAR XCEL 12X100 BLDLESS (ENDOMECHANICALS) ×2 IMPLANT
TROCAR XCEL NON-BLD 5MMX100MML (ENDOMECHANICALS) ×2 IMPLANT
TUBING INSUFFLATION (TUBING) ×2 IMPLANT

## 2018-05-05 SURGICAL SUPPLY — 25 items
CANISTER SUCT 1200ML W/VALVE (MISCELLANEOUS) ×2 IMPLANT
CLIP HMST 235XBRD CATH ROT (MISCELLANEOUS) IMPLANT
CLIP RESOLUTION 360 11X235 (MISCELLANEOUS)
ELECT REM PT RETURN 9FT ADLT (ELECTROSURGICAL)
ELECTRODE REM PT RTRN 9FT ADLT (ELECTROSURGICAL) IMPLANT
FCP ESCP3.2XJMB 240X2.8X (MISCELLANEOUS)
FORCEPS BIOP RAD 4 LRG CAP 4 (CUTTING FORCEPS) IMPLANT
FORCEPS BIOP RJ4 240 W/NDL (MISCELLANEOUS)
FORCEPS ESCP3.2XJMB 240X2.8X (MISCELLANEOUS) IMPLANT
GOWN CVR UNV OPN BCK APRN NK (MISCELLANEOUS) ×2 IMPLANT
GOWN ISOL THUMB LOOP REG UNIV (MISCELLANEOUS) ×2
INJECTOR VARIJECT VIN23 (MISCELLANEOUS) IMPLANT
KIT DEFENDO VALVE AND CONN (KITS) IMPLANT
KIT ENDO PROCEDURE OLY (KITS) ×2 IMPLANT
MARKER SPOT ENDO TATTOO 5ML (MISCELLANEOUS) IMPLANT
PROBE APC STR FIRE (PROBE) IMPLANT
RETRIEVER NET ROTH 2.5X230 LF (MISCELLANEOUS) IMPLANT
SNARE COLD EXACTO (MISCELLANEOUS) IMPLANT
SNARE SHORT THROW 13M SML OVAL (MISCELLANEOUS) IMPLANT
SNARE SHORT THROW 30M LRG OVAL (MISCELLANEOUS) IMPLANT
SNARE SNG USE RND 15MM (INSTRUMENTS) IMPLANT
SPOT EX ENDOSCOPIC TATTOO (MISCELLANEOUS)
TRAP ETRAP POLY (MISCELLANEOUS) IMPLANT
VARIJECT INJECTOR VIN23 (MISCELLANEOUS)
WATER STERILE IRR 250ML POUR (IV SOLUTION) ×2 IMPLANT

## 2018-05-05 NOTE — H&P (Addendum)
SURGICAL ADMISSION HISTORY AND PHYSICAL  Patient seen and examined as described below with surgical PA-C, Gillermina Phy.  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   - NPO, IV fluids (last ate at noon today)  - IV antibiotics (ceftriaxone and metronidazole)  - all risks, benefits, and alternatives to appendectomy were discussed with the patient and her husband, all of their questions were answered to their expressed satisfaction, patient expresses she wishes to proceed, and informed consent was obtained.  - plan for laparoscopic appendectomy tonight pending anesthesia and OR availability  - DVT prophylaxis  I have personally reviewed the patient's chart, evaluated/examined the patient, proposed the recommended management, and discussed these recommendations with the patient and her husband to their expressed satisfaction as well as with patient's RN and ED physician.  -- Scherrie Gerlach [name], MD, RPVI [place]: [facility] General Surgery - Partnering for exceptional care. Office: [phone]   SURGICAL HISTORY AND PHYSICAL NOTE   HISTORY OF PRESENT ILLNESS (HPI):  52 y.o. female presented to [facility] ED today for evaluation of appendicitis. Patient underwent surveillance colonoscopy this morning (05/05/18) with Dr. Allegra Lai which was unremarkable aside from the finding of purulence draining from the appendiceal orifice into the cecum. Following the procedure, she was sent for evaluation with CT which was concerning for appendicitis, and she was referred to the ED. She denied any abdominal pain, nausea, emesis, fevers, chills, or constipation. She denied  any history of RLQ abdominal pain in the past that she could recall. She last ate around 12pm today. No history of previous abdominal surgeries or FHx of colon CA. She is otherwise fairly healthy. Former smoker and quit in 2004.   Surgery is consulted by emergency medicine physician Dr. Willy Eddy, MD in this context for evaluation and management of appendicitis.   PAST MEDICAL HISTORY (PMH):      Past Medical History:  Diagnosis Date  . Anxiety   . Depression   . GERD (gastroesophageal reflux disease)   . Hashimoto's disease   . Kidney stones      PAST SURGICAL HISTORY [facility]):       Past Surgical History:  Procedure Laterality Date  . BUNIONECTOMY  2013 2017   RightX 2     MEDICATIONS:         Prior to Admission medications   Medication Sig Start Date End Date Taking? Authorizing Provider  b complex vitamins capsule Take 1 capsule by mouth daily.   Yes [provider]  buPROPion (WELLBUTRIN XL) 150 MG 24 hr tablet Take 150 mg by mouth daily.   Yes [provider]  busPIRone (BUSPAR) 10 MG tablet Take 20 mg by mouth 2 (two) times daily.    Yes [provider]  cholecalciferol 25 MCG (1000 UT) TABS Take 2,000 Units by mouth daily.    Yes [provider]  DULoxetine (CYMBALTA) 60 MG capsule Take 60 mg by mouth daily.   Yes [provider]  hydrOXYzine (ATARAX/VISTARIL) 50 MG tablet Take 50-100 mg by mouth at bedtime as needed (sleep).  04/13/18  Yes [provider]  levothyroxine (SYNTHROID, LEVOTHROID) 125 MCG tablet Take 1 tablet by mouth daily. 03/31/18  Yes Reubin Milan, MD  Magnesium 500 MG CAPS Take 500 mg by mouth daily.  Yes [provider]  Omega-3 Fatty Acids (FISH OIL) 1000 MG CAPS Take 1,000 mg by mouth daily.    Yes [provider]  RaNITidine HCl (RANITIDINE 150 MAX STRENGTH PO) Take 150 mg by mouth daily.    Yes [provider]     ALLERGIES:   No Known Allergies   SOCIAL HISTORY:  Social History        Socioeconomic History  . Marital status: Married    Spouse name: Not on file  . Number of children: 3  . Years of education: Not on file  . Highest education level: Not on file  Occupational History  . Not on file  Social Needs  . Financial resource strain: Not on file  . Food insecurity:    Worry: Not on file    Inability: Not on file  . Transportation needs:    Medical: Not on file    Non-medical: Not on file  Tobacco Use  . Smoking status: Former Smoker    Packs/day: 0.50    Years: 20.00    Pack years: 10.00    Last attempt to quit: 09/30/2002    Years since quitting: 15.6  . Smokeless tobacco: Never Used  Substance and Sexual Activity  . Alcohol use: Yes    Alcohol/week: 7.0 standard drinks    Types: 7 Glasses of wine per week    Comment:    . Drug use: Never  . Sexual activity: Not on file  Lifestyle  . Physical activity:    Days per week: Not on file    Minutes per session: Not on file  . Stress: Not on file  Relationships  . Social connections:    Talks on phone: Not on file    Gets together: Not on file    Attends religious service: Not on file    Active member of club or organization: Not on file    Attends meetings of clubs or organizations: Not on file    Relationship status: Not on file  . Intimate partner violence:    Fear of current or ex partner: Not on file    Emotionally abused: Not on file    Physically abused: Not on file    Forced sexual activity: Not on file  Other Topics Concern  . Not on file  Social History Narrative  . Not on file    The patient currently resides (home / rehab facility / nursing home): Home The patient normally is (ambulatory / bedbound): Ambulatory   FAMILY HISTORY:       Family History  Problem Relation Age of Onset  . Hypertension Mother   . Heart disease Mother   . Hypertension Father         14 at death  . Heart disease Father        MI in his 32's  . Heart attack Sister 12     REVIEW OF SYSTEMS:  Constitutional: denies weight loss, fever, chills, or sweats  Eyes: denies any other vision changes, history of eye injury  ENT: denies sore throat, hearing problems  Respiratory: denies shortness of breath, wheezing  Cardiovascular: denies chest pain, palpitations  Gastrointestinal: denies abdominal pain, N/V, or diarrhea/and bowel function as per HPI Genitourinary: denies burning with urination or urinary frequency Musculoskeletal: denies any other joint pains or cramps  Skin: denies any other rashes or skin discolorations  Neurological: denies any other headache, dizziness, weakness  Psychiatric: denies any other depression, anxiety   All  other review of systems were negative   VITAL SIGNS:  Temp:  [97.7 F (36.5 C)-98.2 F (36.8 C)] 98.2 F (36.8 C) (11/06 1421) Pulse Rate:  [72-81] 77 (11/06 1421) Resp:  [14-19] 18 (11/06 1421) BP: (114-145)/(70-88) 145/83 (11/06 1421) SpO2:  [97 %-99 %] 99 % (11/06 1421) Weight:  [78.5 kg] 78.5 kg (11/06 1423)     Height: 5\' 2"  (157.5 cm) Weight: 78.5 kg BMI (Calculated): 31.63   INTAKE/OUTPUT:  This shift: No intake/output data recorded.  Last 2 shifts: @IOLAST2SHIFTS @   PHYSICAL EXAM:  Constitutional:  -- Obese body habitus  -- Awake, alert, and oriented x3, no apparent distress Eyes:  -- Pupils equally round and reactive to light  -- No scleral icterus, B/L no occular discharge Ear, nose, throat: -- Neck is FROM WNL Pulmonary:  -- No wheezes or rhales -- Equal breath sounds bilaterally -- Breathing non-labored at rest Cardiovascular:  -- S1, S2 present  -- No pericardial rubs  Gastrointestinal:  -- Abdomen soft, nontender, non-distended, no guarding or rebound tenderness -- No abdominal masses appreciated, pulsatile or otherwise  Musculoskeletal and Integumentary:  -- Wounds or skin discoloration: None  appreciated -- Extremities: B/L UE and LE FROM, hands and feet warm, no edema  Neurologic:  -- Motor function: Intact and symmetric -- Sensation: Intact and symmetric Psychiatric:  -- Mood and affect WNL    Labs:  CBC Latest Ref Rng & Units 05/05/2018 03/30/2018  WBC 4.0 - 10.5 K/uL 5.8 6.1  Hemoglobin 12.0 - 15.0 g/dL 40.3 47.4  Hematocrit 25.9 - 46.0 % 38.5 43.0  Platelets 150 - 400 K/uL 248 304   CMP Latest Ref Rng & Units 05/05/2018 03/30/2018  Glucose 70 - 99 mg/dL 84 70  BUN 6 - 20 mg/dL 12 12  Creatinine 5.63 - 1.00 mg/dL 8.75 6.43  Sodium 329 - 145 mmol/L 141 140  Potassium 3.5 - 5.1 mmol/L 3.7 4.4  Chloride 98 - 111 mmol/L 105 98  CO2 22 - 32 mmol/L 28 25  Calcium 8.9 - 10.3 mg/dL 9.4 51.8  Total Protein 6.5 - 8.1 g/dL 7.0 7.0  Total Bilirubin 0.3 - 1.2 mg/dL 0.6 0.4  Alkaline Phos 38 - 126 U/L 58 80  AST 15 - 41 U/L 32 28  ALT 0 - 44 U/L 35 35(H)    Imaging studies:   -- CT Abdomen/Pelvis on 05/05/18:   IMPRESSION: 1) Findings consistent with appendicitis. Fatty infiltration of the walls of the appendix is compatible with prior bouts of inflammation.  2) 0.7 cm nonobstructing stone lower pole right kidney.  3) Single 1 cm gallstone without evidence of cholecystitis.  4) Fatty infiltration of the liver.  Assessment/Plan: (ICD-10's: K39.80) 52 y.o. female with acute appendicitis, complicated by pertinent comorbidities including depression/anxiety, Hashimotos's, obesity, and former tobacco abuse (smoking).              - NPO, IVF  - IV ABx (Rocephin + Flagyl)  - Pain control as needed, antiemetics as needed   - Monitor abdominal exam  - Morning labs (CBC/BMP)  - Plan for laparoscopic appendectomypending OR availability this evening.   - All risks, benefits, and alternatives to above procedure(s) were discussed with the patient and her family, all of their questions were answered totheir expressed satisfaction, patient expresses she wishes to  proceed, and informed consent was obtained.  - Mobilize  - DVT prophylaxis  All of the above findings and recommendations were discussed with the patient and her family, and all  of patient's and her family's questions were answered to their expressed satisfaction.  Thank you for the opportunity to participate in this patient's care.   -- Lynden Oxford, PA-C [facility] 05/05/2018, 4:04 PM [phone] M-F: 7am - 4pm

## 2018-05-05 NOTE — Anesthesia Procedure Notes (Signed)
Date/Time: 05/05/2018 8:35 AM Performed by: Maryan Rued, CRNA Pre-anesthesia Checklist: Patient identified, Emergency Drugs available, Suction available, Timeout performed and Patient being monitored Patient Re-evaluated:Patient Re-evaluated prior to induction Oxygen Delivery Method: Nasal cannula Placement Confirmation: positive ETCO2

## 2018-05-05 NOTE — Anesthesia Postprocedure Evaluation (Signed)
Anesthesia Post Note  Patient: Tamara Fox  Procedure(s) Performed: COLONOSCOPY WITH PROPOFOL (N/A Rectum)  Patient location during evaluation: PACU Anesthesia Type: General Level of consciousness: awake and alert, oriented and patient cooperative Pain management: pain level controlled Vital Signs Assessment: post-procedure vital signs reviewed and stable Respiratory status: spontaneous breathing, nonlabored ventilation and respiratory function stable Cardiovascular status: blood pressure returned to baseline and stable Postop Assessment: adequate PO intake Anesthetic complications: no    Reed Breech

## 2018-05-05 NOTE — ED Notes (Signed)
Pt states last ate and drank at noon today.  Had half a [place] sandwich, small salad, and soda.

## 2018-05-05 NOTE — Telephone Encounter (Signed)
Colonoscopy today that was performed for colon cancer screening revealed inflamed appendiceal orifice with seepage of puss, this was incidental finding.  Patient underwent CT abdomen was performed right after the colonoscopy which confirmed appendicitis.  It appears that patient had subclinical bouts of appendicitis over time.  Patient has been asymptomatic, denied right lower quadrant or periumbilical pain  IMPRESSION: Findings consistent with appendicitis. Fatty infiltration of the walls of the appendix is compatible with prior bouts of Inflammation.  Communicated results with patient by phone and asked her to go to [facility] ER today.  Patient need to be seen by general surgery to determine timing of appendectomy  Patient expressed understanding of the plan  .Arlyss Repress, MD [address]  [address]  [place], [place] [address]  Main: [phone]  Fax: [phone] Pager: [phone]

## 2018-05-05 NOTE — Consult Note (Addendum)
SURGICAL CONSULTATION NOTE (initial) - cpt: 16109  Patient seen and examined as described below with surgical PA-C, Gillermina Phy.  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   - NPO, IV fluids (last ate at noon today)  - IV antibiotics (ceftriaxone and metronidazole)  - all risks, benefits, and alternatives to appendectomy were discussed with the patient and her husband, all of their questions were answered to their expressed satisfaction, patient expresses she wishes to proceed, and informed consent was obtained.  - plan for laparoscopic appendectomy tonight pending anesthesia and OR availability  - DVT prophylaxis  I have personally reviewed the patient's chart, evaluated/examined the patient, proposed the recommended management, and discussed these recommendations with the patient and her husband to their expressed satisfaction as well as with patient's RN and ED physician.  Thank you for the opportunity to participate in this patient's care.  -- Scherrie Gerlach [name], MD, RPVI [place]: [facility] General Surgery - Partnering for exceptional care. Office: [phone]    SURGICAL CONSULTATION NOTE (initial) - cpt: [id number]  HISTORY OF PRESENT ILLNESS (HPI):  52 y.o. female presented to [facility] ED today for evaluation of appendicitis. Patient underwent surveillance colonoscopy this morning (05/05/18) with Dr. Allegra Lai which was unremarkable aside from the finding of purulence draining from the appendiceal orifice into the cecum. Following the procedure, she was sent for evaluation with CT which was concerning for appendicitis, and she was referred to the  ED. She denied any abdominal pain, nausea, emesis, fevers, chills, or constipation. She denied any history of RLQ abdominal pain in the past that she could recall. She last ate around 12pm today. No history of previous abdominal surgeries or FHx of colon CA. She is otherwise fairly healthy. Former smoker and quit in 2004.   Surgery is consulted by emergency medicine physician Dr. Willy Eddy, MD in this context for evaluation and management of appendicitis.  PAST MEDICAL HISTORY (PMH):  Past Medical History:  Diagnosis Date  . Anxiety   . Depression   . GERD (gastroesophageal reflux disease)   . Hashimoto's disease   . Kidney stones      PAST SURGICAL HISTORY [facility]):  Past Surgical History:  Procedure Laterality Date  . BUNIONECTOMY  2013 2017   RightX 2     MEDICATIONS:  Prior to Admission medications   Medication Sig Start Date End Date Taking? Authorizing Provider  b complex vitamins capsule Take 1 capsule by mouth daily.   Yes [provider]  buPROPion (WELLBUTRIN XL) 150 MG 24 hr tablet Take 150 mg by mouth daily.   Yes [provider]  busPIRone (BUSPAR) 10 MG tablet Take 20 mg by mouth 2 (two) times daily.    Yes [provider]  cholecalciferol 25 MCG (1000 UT) TABS Take 2,000 Units by mouth daily.    Yes [provider]  DULoxetine (CYMBALTA) 60 MG capsule Take 60 mg by mouth daily.   Yes [provider]  hydrOXYzine (ATARAX/VISTARIL) 50 MG tablet Take 50-100 mg by mouth at bedtime as needed (sleep).  04/13/18  Yes [provider]  levothyroxine (SYNTHROID, LEVOTHROID) 125 MCG tablet Take 1 tablet by mouth daily. 03/31/18  Yes Reubin Milan, MD  Magnesium 500 MG CAPS Take 500 mg by mouth daily.  Yes [provider]  Omega-3 Fatty Acids (FISH OIL) 1000 MG CAPS Take 1,000 mg by mouth daily.    Yes [provider]  RaNITidine HCl (RANITIDINE 150 MAX STRENGTH PO) Take 150 mg by mouth daily.    Yes  [provider]    ALLERGIES:  No Known Allergies   SOCIAL HISTORY:  Social History   Socioeconomic History  . Marital status: Married    Spouse name: Not on file  . Number of children: 3  . Years of education: Not on file  . Highest education level: Not on file  Occupational History  . Not on file  Social Needs  . Financial resource strain: Not on file  . Food insecurity:    Worry: Not on file    Inability: Not on file  . Transportation needs:    Medical: Not on file    Non-medical: Not on file  Tobacco Use  . Smoking status: Former Smoker    Packs/day: 0.50    Years: 20.00    Pack years: 10.00    Last attempt to quit: 09/30/2002    Years since quitting: 15.6  . Smokeless tobacco: Never Used  Substance and Sexual Activity  . Alcohol use: Yes    Alcohol/week: 7.0 standard drinks    Types: 7 Glasses of wine per week    Comment:    . Drug use: Never  . Sexual activity: Not on file  Lifestyle  . Physical activity:    Days per week: Not on file    Minutes per session: Not on file  . Stress: Not on file  Relationships  . Social connections:    Talks on phone: Not on file    Gets together: Not on file    Attends religious service: Not on file    Active member of club or organization: Not on file    Attends meetings of clubs or organizations: Not on file    Relationship status: Not on file  . Intimate partner violence:    Fear of current or ex partner: Not on file    Emotionally abused: Not on file    Physically abused: Not on file    Forced sexual activity: Not on file  Other Topics Concern  . Not on file  Social History Narrative  . Not on file    The patient currently resides (home / rehab facility / nursing home): Home The patient normally is (ambulatory / bedbound): Ambulatory   FAMILY HISTORY:  Family History  Problem Relation Age of Onset  . Hypertension Mother   . Heart disease Mother   . Hypertension Father        17 at death  . Heart  disease Father        MI in his 16's  . Heart attack Sister 33    REVIEW OF SYSTEMS:  Constitutional: denies weight loss, fever, chills, or sweats  Eyes: denies any other vision changes, history of eye injury  ENT: denies sore throat, hearing problems  Respiratory: denies shortness of breath, wheezing  Cardiovascular: denies chest pain, palpitations  Gastrointestinal: denies abdominal pain, N/V, or diarrhea/and bowel function as per HPI Genitourinary: denies burning with urination or urinary frequency Musculoskeletal: denies any other joint pains or cramps  Skin: denies any other rashes or skin discolorations  Neurological: denies any other headache, dizziness, weakness  Psychiatric: denies any other depression, anxiety   All other review of systems were negative   VITAL SIGNS:  Temp:  [  97.7 F (36.5 C)-98.2 F (36.8 C)] 98.2 F (36.8 C) (11/06 1421) Pulse Rate:  [72-81] 77 (11/06 1421) Resp:  [14-19] 18 (11/06 1421) BP: (114-145)/(70-88) 145/83 (11/06 1421) SpO2:  [97 %-99 %] 99 % (11/06 1421) Weight:  [78.5 kg] 78.5 kg (11/06 1423)     Height: 5\' 2"  (157.5 cm) Weight: 78.5 kg BMI (Calculated): 31.63   INTAKE/OUTPUT:  This shift: No intake/output data recorded.  Last 2 shifts: @IOLAST2SHIFTS @   PHYSICAL EXAM:  Constitutional:  -- Obese body habitus  -- Awake, alert, and oriented x3, no apparent distress Eyes:  -- Pupils equally round and reactive to light  -- No scleral icterus, B/L no occular discharge Ear, nose, throat: -- Neck is FROM WNL Pulmonary:  -- No wheezes or rhales -- Equal breath sounds bilaterally -- Breathing non-labored at rest Cardiovascular:  -- S1, S2 present  -- No pericardial rubs  Gastrointestinal:  -- Abdomen soft, nontender, non-distended, no guarding or rebound tenderness -- No abdominal masses appreciated, pulsatile or otherwise  Musculoskeletal and Integumentary:  -- Wounds or skin discoloration: None appreciated -- Extremities: B/L  UE and LE FROM, hands and feet warm, no edema  Neurologic:  -- Motor function: Intact and symmetric -- Sensation: Intact and symmetric Psychiatric:  -- Mood and affect WNL  Labs:  CBC Latest Ref Rng & Units 05/05/2018 03/30/2018  WBC 4.0 - 10.5 K/uL 5.8 6.1  Hemoglobin 12.0 - 15.0 g/dL 16.1 09.6  Hematocrit 04.5 - 46.0 % 38.5 43.0  Platelets 150 - 400 K/uL 248 304   CMP Latest Ref Rng & Units 05/05/2018 03/30/2018  Glucose 70 - 99 mg/dL 84 70  BUN 6 - 20 mg/dL 12 12  Creatinine 4.09 - 1.00 mg/dL 8.11 9.14  Sodium 782 - 145 mmol/L 141 140  Potassium 3.5 - 5.1 mmol/L 3.7 4.4  Chloride 98 - 111 mmol/L 105 98  CO2 22 - 32 mmol/L 28 25  Calcium 8.9 - 10.3 mg/dL 9.4 95.6  Total Protein 6.5 - 8.1 g/dL 7.0 7.0  Total Bilirubin 0.3 - 1.2 mg/dL 0.6 0.4  Alkaline Phos 38 - 126 U/L 58 80  AST 15 - 41 U/L 32 28  ALT 0 - 44 U/L 35 35(H)    Imaging studies:  CT Abdomen/Pelvis (05/05/2018):  1) Findings consistent with appendicitis. Fatty infiltration of the walls of the appendix is compatible with prior bouts of inflammation. 2) 0.7 cm nonobstructing stone lower pole right kidney. 3) Single 1 cm gallstone without evidence of cholecystitis. 4) Fatty infiltration of the liver.  Assessment/Plan: (ICD-10's: K7.80) 52 y.o. female with acute appendicitis, complicated by pertinent comorbidities including depression/anxiety, Hashimotos's, obesity, and former tobacco abuse (smoking).   - NPO, IVF  - IV ABx (Rocephin + Flagyl)  - Monitor abdominal exam  - Pain control as needed, antiemetics as needed   - Plan for laparoscopic appendectomypending OR availability this evening.   - All risks, benefits, and alternatives to above procedure(s) were discussed with the patient and her family, all of their questions were answered totheir expressed satisfaction, patient expresses she wishes to proceed, and informed consent was obtained.  - DVT prophylaxis  All of the above findings and recommendations  were discussed with the patient and her family, and all of patient's and her family's questions were answered to their expressed satisfaction.  Thank you for the opportunity to participate in this patient's care.   -- Lynden Oxford, PA-C [facility] 05/05/2018, 4:04 PM [phone] M-F: 7am - 4pm

## 2018-05-05 NOTE — Op Note (Signed)
[facility] Patient Name: Tamara Fox Procedure Date: 05/05/2018 7:33 AM MRN: [id number] Account #: [id number] Date of Birth: 02-Jun-1966 Admit Type: Outpatient Age: 52 Room: [facility] OR ROOM 01 Gender: Female Note Status: Finalized Procedure:            Colonoscopy Indications:          Screening for colorectal malignant neoplasm, This is                        the patient's first colonoscopy Providers:            Lin Landsman MD, MD Referring MD:         Halina Maidens, MD (Referring MD) Medicines:            Monitored Anesthesia Care Complications:        No immediate complications. Estimated blood loss: None. Procedure:            Pre-Anesthesia Assessment:                       - Prior to the procedure, a History and Physical was                        performed, and patient medications and allergies were                        reviewed. The patient is competent. The risks and                        benefits of the procedure and the sedation options and                        risks were discussed with the patient. All questions                        were answered and informed consent was obtained.                        Patient identification and proposed procedure were                        verified by the physician, the nurse, the                        anesthesiologist, the anesthetist and the technician in                        the pre-procedure area in the procedure room in the                        endoscopy suite. Mental Status Examination: alert and                        oriented. Airway Examination: normal oropharyngeal                        airway and neck mobility. Respiratory Examination:                        clear to auscultation. CV Examination: normal.  Prophylactic Antibiotics: The patient does not require                        prophylactic antibiotics. Prior Anticoagulants: The          patient has taken no previous anticoagulant or                        antiplatelet agents. ASA Grade Assessment: II - A                        patient with mild systemic disease. After reviewing the                        risks and benefits, the patient was deemed in                        satisfactory condition to undergo the procedure. The                        anesthesia plan was to use monitored anesthesia care                        (MAC). Immediately prior to administration of                        medications, the patient was re-assessed for adequacy                        to receive sedatives. The heart rate, respiratory rate,                        oxygen saturations, blood pressure, adequacy of                        pulmonary ventilation, and response to care were                        monitored throughout the procedure. The physical status                        of the patient was re-assessed after the procedure.                       After obtaining informed consent, the colonoscope was                        passed under direct vision. Throughout the procedure,                        the patient's blood pressure, pulse, and oxygen                        saturations were monitored continuously. The was                        introduced through the anus and advanced to the the                        cecum, identified by appendiceal orifice and ileocecal  valve. The colonoscopy was performed without                        difficulty. The patient tolerated the procedure well.                        The quality of the bowel preparation was evaluated                        using the BBPS [facility] Bowel Preparation Scale) with                        scores of: Right Colon = 3, Transverse Colon = 3 and                        Left Colon = 3 (entire mucosa seen well with no                        residual staining, small fragments of stool or opaque                         liquid). The total BBPS score equals 9. Findings:      The perianal and digital rectal examinations were normal. Pertinent       negatives include normal sphincter tone and no palpable rectal lesions.      Prominent appendiceal orifice, bulging into cecum, seepage of whitish       material representing pus      The colon (entire examined portion) appeared normal.      The retroflexed view of the distal rectum and anal verge was normal and       showed no anal or rectal abnormalities. Impression:           - Prominent appendiceal orifice, bulging into cecum,                        seepage of whitish material representing pus concern                        for appendicitis or neoplasm                       - The entire examined colon is normal.                       - The distal rectum and anal verge are normal on                        retroflexion view.                       - No specimens collected. Recommendation:       - Discharge patient to home (with escort).                       - Perform CT scan (computed tomography) of the abdomen                        with contrast to evaluate appendix at appointment to be  scheduled.                       - Refer to a surgeon at appointment to be scheduled.                       - Resume previous diet today.                       - Continue present medications.                       - Repeat colonoscopy in 10 years for surveillance. Procedure Code(s):    --- Professional ---                       F6812, Colorectal cancer screening; colonoscopy on                        individual not meeting criteria for high risk Diagnosis Code(s):    --- Professional ---                       Z12.11, Encounter for screening for malignant neoplasm                        of colon CPT copyright 2018 American Medical Association. All rights reserved. The codes documented in this report are preliminary and upon coder  review may  be revised to meet current compliance requirements. Dr. Ulyess Mort Lin Landsman MD, MD 05/05/2018 9:01:17 AM This report has been signed electronically. Number of Addenda: 0 Note Initiated On: 05/05/2018 7:33 AM Scope Withdrawal Time: 0 hours 9 minutes 33 seconds  Total Procedure Duration: 0 hours 15 minutes 13 seconds       [facility]

## 2018-05-05 NOTE — ED Triage Notes (Signed)
Had colonoscopy and they found appendicitis. So dr Kevan Ny sent her here for surgery.

## 2018-05-05 NOTE — Progress Notes (Signed)
Called Pharmacy about antibiotics schdule. They informed me to go ahead and give it.

## 2018-05-05 NOTE — H&P (Signed)
Arlyss Repress, MD [address]  [address]  [place], [place] [address]  Main: [phone]  Fax: [phone] Pager: [phone]  Primary Care Physician:  Reubin Milan, MD Primary Gastroenterologist:  Dr. Arlyss Repress  Pre-Procedure History & Physical: HPI:  Tamara Fox is a 52 y.o. female is here for an colonoscopy.   Past Medical History:  Diagnosis Date  . Anxiety   . Depression   . GERD (gastroesophageal reflux disease)   . Hashimoto's disease   . Kidney stones     Past Surgical History:  Procedure Laterality Date  . BUNIONECTOMY  2013 2017   RightX 2    Prior to Admission medications   Medication Sig Start Date End Date Taking? Authorizing Provider  b complex vitamins capsule Take 1 capsule by mouth daily.   Yes [provider]  buPROPion (WELLBUTRIN XL) 150 MG 24 hr tablet Take 150 mg by mouth daily.   Yes [provider]  busPIRone (BUSPAR) 10 MG tablet Take 10 mg by mouth 2 (two) times daily.   Yes [provider]  Cholecalciferol (VITAMIN D) 2000 units CAPS    Yes [provider]  DULoxetine (CYMBALTA) 60 MG capsule Take 60 mg by mouth daily.   Yes [provider]  hydrOXYzine (ATARAX/VISTARIL) 25 MG tablet Take 50 mg by mouth. 12/14/15  Yes [provider]  levothyroxine (SYNTHROID, LEVOTHROID) 125 MCG tablet Take 1 tablet by mouth daily. 03/31/18  Yes Reubin Milan, MD  Omega-3 Fatty Acids (FISH OIL PO) Take by mouth.   Yes [provider]  RaNITidine HCl (RANITIDINE 150 MAX STRENGTH PO) Take 150 mg by mouth daily.    Yes [provider]    Allergies as of 03/30/2018  . (No Known Allergies)    Family History  Problem Relation Age of Onset  . Hypertension Mother   . Heart disease Mother   . Hypertension Father        31 at death  . Heart disease Father        MI in his 27's  . Heart attack Sister 59    Social History   Socioeconomic History  . Marital status:  Married    Spouse name: Not on file  . Number of children: 3  . Years of education: Not on file  . Highest education level: Not on file  Occupational History  . Not on file  Social Needs  . Financial resource strain: Not on file  . Food insecurity:    Worry: Not on file    Inability: Not on file  . Transportation needs:    Medical: Not on file    Non-medical: Not on file  Tobacco Use  . Smoking status: Former Smoker    Packs/day: 0.50    Years: 20.00    Pack years: 10.00    Last attempt to quit: 09/30/2002    Years since quitting: 15.6  . Smokeless tobacco: Never Used  Substance and Sexual Activity  . Alcohol use: Yes    Alcohol/week: 7.0 standard drinks    Types: 7 Glasses of wine per week    Comment:    . Drug use: Never  . Sexual activity: Not on file  Lifestyle  . Physical activity:    Days per week: Not on file    Minutes per session: Not on file  . Stress: Not on file  Relationships  . Social connections:    Talks on phone: Not on file  Gets together: Not on file    Attends religious service: Not on file    Active member of club or organization: Not on file    Attends meetings of clubs or organizations: Not on file    Relationship status: Not on file  . Intimate partner violence:    Fear of current or ex partner: Not on file    Emotionally abused: Not on file    Physically abused: Not on file    Forced sexual activity: Not on file  Other Topics Concern  . Not on file  Social History Narrative  . Not on file    Review of Systems: See HPI, otherwise negative ROS  Physical Exam: BP (!) 144/88   Pulse 72   Temp 98.1 F (36.7 C) (Temporal)   Ht 5\' 2"  (1.575 m)   Wt 78.5 kg   LMP 02/11/2018 (Approximate)   SpO2 98%   BMI 31.64 kg/m  General:   Alert,  pleasant and cooperative in NAD Head:  Normocephalic and atraumatic. Neck:  Supple; no masses or thyromegaly. Lungs:  Clear throughout to auscultation.    Heart:  Regular rate and rhythm. Abdomen:   Soft, nontender and nondistended. Normal bowel sounds, without guarding, and without rebound.   Neurologic:  Alert and  oriented x4;  grossly normal neurologically.  Impression/Plan: [name] is here for an colonoscopy to be performed for colon cancer screening  Risks, benefits, limitations, and alternatives regarding  colonoscopy have been reviewed with the patient.  Questions have been answered.  All parties agreeable.   Lannette Donath, MD  05/05/2018, 8:27 AM

## 2018-05-05 NOTE — ED Notes (Signed)
Pt ambulated to restroom with no difficulty.

## 2018-05-05 NOTE — ED Triage Notes (Signed)
Pt went to have colonoscopy where they found evidence for possible appendicitis. Pt was sent for CT abdomen where appendicitis was confirmed. Pt denies any pain or complaints. Pt states she last ate at 1200 today.

## 2018-05-05 NOTE — Transfer of Care (Signed)
Immediate Anesthesia Transfer of Care Note  Patient: Tamara Fox  Procedure(s) Performed: COLONOSCOPY WITH PROPOFOL (N/A Rectum)  Patient Location: PACU  Anesthesia Type: General  Level of Consciousness: awake, alert  and patient cooperative  Airway and Oxygen Therapy: Patient Spontanous Breathing and Patient connected to supplemental oxygen  Post-op Assessment: Post-op Vital signs reviewed, Patient's Cardiovascular Status Stable, Respiratory Function Stable, Patent Airway and No signs of Nausea or vomiting  Post-op Vital Signs: Reviewed and stable  Complications: No apparent anesthesia complications

## 2018-05-05 NOTE — ED Provider Notes (Signed)
[facility] Emergency Department Provider Note    First MD Initiated Contact with Patient 05/05/18 1432     (approximate)  I have reviewed the triage vital signs and the nursing notes.   HISTORY  Chief Complaint Appendicitis    HPI Tamara Fox is a 52 y.o. female no significant past medical history presents the ER for evaluation CT imaging showed evidence of appendicitis.  Patient had a routine colonoscopy done today and on the colonoscopy report Dr. Allegra Lai sole purulent secretion coming from dilated and inflamed appearing appendix around the cecum.  She is not having any pain at that time and was sent for CT imaging for further evaluation.  CT imaging does show evidence of appendicitis without perforation or abscess.  Patient is pain-free.  She has been n.p.o. since noon.  Denies any fevers.  Otherwise asymptomatic.  No history of IBD.    Past Medical History:  Diagnosis Date  . Anxiety   . Depression   . GERD (gastroesophageal reflux disease)   . Hashimoto's disease   . Kidney stones    Family History  Problem Relation Age of Onset  . Hypertension Mother   . Heart disease Mother   . Hypertension Father        55 at death  . Heart disease Father        MI in his 65's  . Heart attack Sister 47   Past Surgical History:  Procedure Laterality Date  . BUNIONECTOMY  2013 2017   RightX 2   Patient Active Problem List   Diagnosis Date Noted  . Colon cancer screening   . High serum high density lipoprotein (HDL) 09/21/2017  . Hashimoto's thyroiditis 09/21/2017  . History of renal stone 09/21/2017  . BMI 31.0-31.9,adult 09/21/2017  . Major depression single episode, in partial remission (HCC) 02/05/2015      Prior to Admission medications   Medication Sig Start Date End Date Taking? Authorizing Provider  b complex vitamins capsule Take 1 capsule by mouth daily.   Yes [provider]  buPROPion (WELLBUTRIN XL) 150 MG 24 hr tablet  Take 150 mg by mouth daily.   Yes [provider]  busPIRone (BUSPAR) 10 MG tablet Take 20 mg by mouth 2 (two) times daily.    Yes [provider]  cholecalciferol 25 MCG (1000 UT) TABS Take 2,000 Units by mouth daily.    Yes [provider]  DULoxetine (CYMBALTA) 60 MG capsule Take 60 mg by mouth daily.   Yes [provider]  hydrOXYzine (ATARAX/VISTARIL) 50 MG tablet Take 50-100 mg by mouth at bedtime as needed (sleep).  04/13/18  Yes [provider]  levothyroxine (SYNTHROID, LEVOTHROID) 125 MCG tablet Take 1 tablet by mouth daily. 03/31/18  Yes Reubin Milan, MD  Magnesium 500 MG CAPS Take 500 mg by mouth daily.   Yes [provider]  Omega-3 Fatty Acids (FISH OIL) 1000 MG CAPS Take 1,000 mg by mouth daily.    Yes [provider]  RaNITidine HCl (RANITIDINE 150 MAX STRENGTH PO) Take 150 mg by mouth daily.    Yes [provider]    Allergies Patient has no known allergies.    Social History Social History   Tobacco Use  . Smoking status: Former Smoker    Packs/day: 0.50    Years: 20.00    Pack years: 10.00    Last attempt to quit: 09/30/2002    Years since quitting: 15.6  . Smokeless tobacco:  Never Used  Substance Use Topics  . Alcohol use: Yes    Alcohol/week: 7.0 standard drinks    Types: 7 Glasses of wine per week    Comment:    . Drug use: Never    Review of Systems Patient denies headaches, rhinorrhea, blurry vision, numbness, shortness of breath, chest pain, edema, cough, abdominal pain, nausea, vomiting, diarrhea, dysuria, fevers, rashes or hallucinations unless otherwise stated above in HPI. ____________________________________________   PHYSICAL EXAM:  VITAL SIGNS: Vitals:   05/05/18 1421  BP: (!) 145/83  Pulse: 77  Resp: 18  Temp: 98.2 F (36.8 C)  SpO2: 99%    Constitutional: Alert and oriented.  Eyes: Conjunctivae are normal.  Head: Atraumatic. Nose: No  congestion/rhinnorhea. Mouth/Throat: Mucous membranes are moist.   Neck: No stridor. Painless ROM.  Cardiovascular: Normal rate, regular rhythm. Grossly normal heart sounds.  Good peripheral circulation. Respiratory: Normal respiratory effort.  No retractions. Lungs CTAB. Gastrointestinal: Soft and nontender in all four quadrants. No distention. No abdominal bruits. No CVA tenderness. Genitourinary:  Musculoskeletal: No lower extremity tenderness nor edema.  No joint effusions. Neurologic:  Normal speech and language. No gross focal neurologic deficits are appreciated. No facial droop Skin:  Skin is warm, dry and intact. No rash noted. Psychiatric: Mood and affect are normal. Speech and behavior are normal.  ____________________________________________   LABS (all labs ordered are listed, but only abnormal results are displayed)  Results for orders placed or performed during the hospital encounter of 05/05/18 (from the past 24 hour(s))  CBC with Differential     Status: None   Collection Time: 05/05/18  2:56 PM  Result Value Ref Range   WBC 5.8 4.0 - 10.5 K/uL   RBC 4.24 3.87 - 5.11 MIL/uL   Hemoglobin 12.8 12.0 - 15.0 g/dL   HCT 81.1 91.4 - 78.2 %   MCV 90.8 80.0 - 100.0 fL   MCH 30.2 26.0 - 34.0 pg   MCHC 33.2 30.0 - 36.0 g/dL   RDW 95.6 21.3 - 08.6 %   Platelets 248 150 - 400 K/uL   nRBC 0.0 0.0 - 0.2 %   Neutrophils Relative % 51 %   Neutro Abs 2.9 1.7 - 7.7 K/uL   Lymphocytes Relative 37 %   Lymphs Abs 2.2 0.7 - 4.0 K/uL   Monocytes Relative 10 %   Monocytes Absolute 0.6 0.1 - 1.0 K/uL   Eosinophils Relative 1 %   Eosinophils Absolute 0.1 0.0 - 0.5 K/uL   Basophils Relative 1 %   Basophils Absolute 0.1 0.0 - 0.1 K/uL   Immature Granulocytes 0 %   Abs Immature Granulocytes 0.01 0.00 - 0.07 K/uL  Comprehensive metabolic panel     Status: None   Collection Time: 05/05/18  2:56 PM  Result Value Ref Range   Sodium 141 135 - 145 mmol/L   Potassium 3.7 3.5 - 5.1 mmol/L    Chloride 105 98 - 111 mmol/L   CO2 28 22 - 32 mmol/L   Glucose, Bld 84 70 - 99 mg/dL   BUN 12 6 - 20 mg/dL   Creatinine, Ser 5.78 0.44 - 1.00 mg/dL   Calcium 9.4 8.9 - 46.9 mg/dL   Total Protein 7.0 6.5 - 8.1 g/dL   Albumin 4.0 3.5 - 5.0 g/dL   AST 32 15 - 41 U/L   ALT 35 0 - 44 U/L   Alkaline Phosphatase 58 38 - 126 U/L   Total Bilirubin 0.6 0.3 - 1.2 mg/dL  GFR calc non Af Amer >60 >60 mL/min   GFR calc Af Amer >60 >60 mL/min   Anion gap 8 5 - 15  Type and screen [facility]     Status: None   Collection Time: 05/05/18  2:56 PM  Result Value Ref Range   ABO/RH(D) O POS    Antibody Screen POS    Sample Expiration      05/08/2018 Performed at [facility], [address], [place], [place] [address]    ____________________________________________ ____________________________________________  RADIOLOGY  I personally reviewed all radiographic images ordered to evaluate for the above acute complaints and reviewed radiology reports and findings.  These findings were personally discussed with the patient.  Please see medical record for radiology report.  ____________________________________________   PROCEDURES  Procedure(s) performed:  Procedures    Critical Care performed: no ____________________________________________   INITIAL IMPRESSION / ASSESSMENT AND PLAN / ED COURSE  Pertinent labs & imaging results that were available during my care of the patient were reviewed by me and considered in my medical decision making (see chart for details).   DDX: appendicitis, ibd, diverticulitis, cecal inflammation  Fatimata Talsma is a 52 y.o. who presents to the ED with symptoms as described above.  Patient is afebrile and hemodynamically stable.  Blood work will be ordered for the above differential.  CT imaging does show evidence of appendicitis.  Possible chronic appendicitis.  Will consult general surgery.  Will give dose of IV antibiotics  given report of purulence emanating from the appendix on colonoscopy today.  Clinical Course as of May 05 1549  Wed May 05, 2018  1549 Patient has been evaluated by general surgery with plan to go to the OR for appendectomy.   [PR]    Clinical Course User Index [PR] Willy Eddy, MD     As part of my medical decision making, I reviewed the following data within the electronic MEDICAL RECORD NUMBER Nursing notes reviewed and incorporated, Labs reviewed, notes from prior ED visits and [place] Controlled Substance Database   ____________________________________________   FINAL CLINICAL IMPRESSION(S) / ED DIAGNOSES  Final diagnoses:  Other appendicitis      NEW MEDICATIONS STARTED DURING THIS VISIT:  New Prescriptions   No medications on file     Note:  This document was prepared using Dragon voice recognition software and may include unintentional dictation errors.    Willy Eddy, MD 05/05/18 1550

## 2018-05-06 ENCOUNTER — Observation Stay: Payer: 59 | Admitting: Anesthesiology

## 2018-05-06 ENCOUNTER — Encounter: Payer: Self-pay | Admitting: Surgery

## 2018-05-06 ENCOUNTER — Other Ambulatory Visit: Payer: 59

## 2018-05-06 ENCOUNTER — Ambulatory Visit: Payer: 59

## 2018-05-06 DIAGNOSIS — K388 Other specified diseases of appendix: Secondary | ICD-10-CM | POA: Diagnosis not present

## 2018-05-06 DIAGNOSIS — F418 Other specified anxiety disorders: Secondary | ICD-10-CM | POA: Diagnosis not present

## 2018-05-06 DIAGNOSIS — E063 Autoimmune thyroiditis: Secondary | ICD-10-CM | POA: Diagnosis not present

## 2018-05-06 DIAGNOSIS — E669 Obesity, unspecified: Secondary | ICD-10-CM | POA: Diagnosis not present

## 2018-05-06 DIAGNOSIS — F324 Major depressive disorder, single episode, in partial remission: Secondary | ICD-10-CM | POA: Diagnosis not present

## 2018-05-06 DIAGNOSIS — K3589 Other acute appendicitis without perforation or gangrene: Secondary | ICD-10-CM | POA: Diagnosis not present

## 2018-05-06 DIAGNOSIS — Z79890 Hormone replacement therapy (postmenopausal): Secondary | ICD-10-CM | POA: Diagnosis not present

## 2018-05-06 DIAGNOSIS — Z79899 Other long term (current) drug therapy: Secondary | ICD-10-CM | POA: Diagnosis not present

## 2018-05-06 DIAGNOSIS — Z1211 Encounter for screening for malignant neoplasm of colon: Secondary | ICD-10-CM | POA: Diagnosis not present

## 2018-05-06 DIAGNOSIS — K37 Unspecified appendicitis: Secondary | ICD-10-CM | POA: Diagnosis not present

## 2018-05-06 DIAGNOSIS — K219 Gastro-esophageal reflux disease without esophagitis: Secondary | ICD-10-CM | POA: Diagnosis not present

## 2018-05-06 DIAGNOSIS — K3580 Unspecified acute appendicitis: Secondary | ICD-10-CM | POA: Diagnosis not present

## 2018-05-06 DIAGNOSIS — F411 Generalized anxiety disorder: Secondary | ICD-10-CM | POA: Diagnosis not present

## 2018-05-06 MED ORDER — OXYCODONE HCL 5 MG PO TABS
5.0000 mg | ORAL_TABLET | ORAL | Status: DC | PRN
  Administered 2018-05-06 (×2): 5 mg via ORAL
  Filled 2018-05-06 (×2): qty 1

## 2018-05-06 MED ORDER — PROPOFOL 10 MG/ML IV BOLUS
INTRAVENOUS | Status: AC
  Filled 2018-05-06: qty 20

## 2018-05-06 MED ORDER — PROPOFOL 10 MG/ML IV BOLUS
INTRAVENOUS | Status: DC | PRN
  Administered 2018-05-06: 150 mg via INTRAVENOUS

## 2018-05-06 MED ORDER — LIDOCAINE HCL (CARDIAC) PF 100 MG/5ML IV SOSY
PREFILLED_SYRINGE | INTRAVENOUS | Status: DC | PRN
  Administered 2018-05-06: 80 mg via INTRAVENOUS

## 2018-05-06 MED ORDER — ACETAMINOPHEN 10 MG/ML IV SOLN
INTRAVENOUS | Status: AC
  Filled 2018-05-06: qty 100

## 2018-05-06 MED ORDER — SUGAMMADEX SODIUM 200 MG/2ML IV SOLN
INTRAVENOUS | Status: AC
  Filled 2018-05-06: qty 2

## 2018-05-06 MED ORDER — ONDANSETRON HCL 4 MG/2ML IJ SOLN
INTRAMUSCULAR | Status: AC
  Filled 2018-05-06: qty 2

## 2018-05-06 MED ORDER — MIDAZOLAM HCL 2 MG/2ML IJ SOLN
INTRAMUSCULAR | Status: DC | PRN
  Administered 2018-05-06: 2 mg via INTRAVENOUS

## 2018-05-06 MED ORDER — ACETAMINOPHEN 500 MG PO TABS
1000.0000 mg | ORAL_TABLET | Freq: Four times a day (QID) | ORAL | Status: DC
  Administered 2018-05-06 (×2): 1000 mg via ORAL
  Filled 2018-05-06 (×2): qty 2

## 2018-05-06 MED ORDER — SUGAMMADEX SODIUM 500 MG/5ML IV SOLN
INTRAVENOUS | Status: DC | PRN
  Administered 2018-05-06: 160 mg via INTRAVENOUS

## 2018-05-06 MED ORDER — LIDOCAINE HCL (PF) 1 % IJ SOLN
INTRAMUSCULAR | Status: AC
  Filled 2018-05-06: qty 30

## 2018-05-06 MED ORDER — FENTANYL CITRATE (PF) 100 MCG/2ML IJ SOLN
INTRAMUSCULAR | Status: AC
  Filled 2018-05-06: qty 2

## 2018-05-06 MED ORDER — OXYCODONE HCL 5 MG PO TABS: 5.0000 mg | ORAL_TABLET | ORAL | 0 refills | Status: DC | PRN

## 2018-05-06 MED ORDER — LIDOCAINE HCL 1 % IJ SOLN
INTRAMUSCULAR | Status: DC | PRN
  Administered 2018-05-06: 20 mL via INTRADERMAL

## 2018-05-06 MED ORDER — DEXAMETHASONE SODIUM PHOSPHATE 10 MG/ML IJ SOLN
INTRAMUSCULAR | Status: AC
  Filled 2018-05-06: qty 1

## 2018-05-06 MED ORDER — ENOXAPARIN SODIUM 40 MG/0.4ML ~~LOC~~ SOLN: 40.0000 mg | SUBCUTANEOUS | Status: DC

## 2018-05-06 MED ORDER — FENTANYL CITRATE (PF) 100 MCG/2ML IJ SOLN
25.0000 ug | INTRAMUSCULAR | Status: DC | PRN
  Administered 2018-05-06 (×2): 50 ug via INTRAVENOUS

## 2018-05-06 MED ORDER — MORPHINE SULFATE (PF) 2 MG/ML IV SOLN
2.0000 mg | INTRAVENOUS | Status: DC | PRN
  Administered 2018-05-06: 2 mg via INTRAVENOUS
  Filled 2018-05-06: qty 1

## 2018-05-06 MED ORDER — ROCURONIUM BROMIDE 100 MG/10ML IV SOLN
INTRAVENOUS | Status: DC | PRN
  Administered 2018-05-06: 30 mg via INTRAVENOUS

## 2018-05-06 MED ORDER — SODIUM CHLORIDE 0.9 % IV SOLN
2.0000 g | INTRAVENOUS | Status: DC
  Filled 2018-05-06: qty 20

## 2018-05-06 MED ORDER — ONDANSETRON HCL 4 MG/2ML IJ SOLN
INTRAMUSCULAR | Status: DC | PRN
  Administered 2018-05-06: 4 mg via INTRAVENOUS

## 2018-05-06 MED ORDER — ONDANSETRON HCL 4 MG/2ML IJ SOLN: 4.0000 mg | Freq: Four times a day (QID) | INTRAMUSCULAR | Status: DC | PRN

## 2018-05-06 MED ORDER — ONDANSETRON 4 MG PO TBDP: 4.0000 mg | ORAL_TABLET | Freq: Four times a day (QID) | ORAL | Status: DC | PRN

## 2018-05-06 MED ORDER — SUCCINYLCHOLINE CHLORIDE 20 MG/ML IJ SOLN
INTRAMUSCULAR | Status: DC | PRN
  Administered 2018-05-06: 100 mg via INTRAVENOUS

## 2018-05-06 MED ORDER — FENTANYL CITRATE (PF) 100 MCG/2ML IJ SOLN
INTRAMUSCULAR | Status: DC | PRN
  Administered 2018-05-06 (×2): 50 ug via INTRAVENOUS

## 2018-05-06 MED ORDER — KETOROLAC TROMETHAMINE 30 MG/ML IJ SOLN
30.0000 mg | Freq: Four times a day (QID) | INTRAMUSCULAR | Status: DC
  Administered 2018-05-06: 30 mg via INTRAVENOUS
  Filled 2018-05-06: qty 1

## 2018-05-06 MED ORDER — FENTANYL CITRATE (PF) 100 MCG/2ML IJ SOLN
INTRAMUSCULAR | Status: AC
  Administered 2018-05-06: 50 ug via INTRAVENOUS
  Filled 2018-05-06: qty 2

## 2018-05-06 MED ORDER — OXYCODONE HCL 5 MG PO TABS: 5.0000 mg | ORAL_TABLET | Freq: Once | ORAL | Status: DC | PRN

## 2018-05-06 MED ORDER — DEXAMETHASONE SODIUM PHOSPHATE 10 MG/ML IJ SOLN
INTRAMUSCULAR | Status: DC | PRN
  Administered 2018-05-06: 10 mg via INTRAVENOUS

## 2018-05-06 MED ORDER — BUPIVACAINE HCL (PF) 0.5 % IJ SOLN
INTRAMUSCULAR | Status: AC
  Filled 2018-05-06: qty 30

## 2018-05-06 MED ORDER — METRONIDAZOLE IN NACL 5-0.79 MG/ML-% IV SOLN
500.0000 mg | Freq: Three times a day (TID) | INTRAVENOUS | Status: AC
  Administered 2018-05-06: 500 mg via INTRAVENOUS
  Filled 2018-05-06: qty 100

## 2018-05-06 MED ORDER — OXYCODONE HCL 5 MG/5ML PO SOLN: 5.0000 mg | Freq: Once | ORAL | Status: DC | PRN

## 2018-05-06 NOTE — Anesthesia Procedure Notes (Signed)
Procedure Name: Intubation Date/Time: 05/06/2018 3:06 AM Performed by: Waldo Laine, CRNA Pre-anesthesia Checklist: Patient identified, Patient being monitored, Timeout performed, Emergency Drugs available and Suction available Patient Re-evaluated:Patient Re-evaluated prior to induction Oxygen Delivery Method: Circle system utilized Preoxygenation: Pre-oxygenation with 100% oxygen Induction Type: IV induction Ventilation: Mask ventilation without difficulty Laryngoscope Size: Miller and 2 Grade View: Grade I Tube type: Oral Tube size: 7.0 mm Number of attempts: 1 Airway Equipment and Method: Stylet Placement Confirmation: ETT inserted through vocal cords under direct vision,  positive ETCO2 and breath sounds checked- equal and bilateral Secured at: 20 cm Tube secured with: Tape Dental Injury: Teeth and Oropharynx as per pre-operative assessment

## 2018-05-06 NOTE — Op Note (Signed)
SURGICAL OPERATIVE REPORT  DATE OF PROCEDURE: 05/06/2018  ATTENDING Surgeon(s): Ancil Linsey, MD  ANESTHESIA: GETA (General)  PRE-OPERATIVE DIAGNOSIS: Appendicitis vs Mucocele (K35.80)  POST-OPERATIVE DIAGNOSIS: Appendicitis vs Mucocele (K35.80)  PROCEDURE(S):  1.) Laparoscopic appendectomy (cpt: 44970)  INTRAOPERATIVE FINDINGS: Non-perforated moderately dilated minimally inflamed appendix surrounded by mild-/moderate- peri-appendiceal inflammation without ascites  INTRAVENOUS FLUIDS: 500 mL crystalloid   ESTIMATED BLOOD LOSS: Minimal (<20 mL)  URINE OUTPUT: 250 mL   SPECIMENS: Appendix  IMPLANTS: None  DRAINS: None  COMPLICATIONS: None apparent  CONDITION AT END OF PROCEDURE: Hemodynamically stable and extubated  DISPOSITION OF PATIENT: PACU  INDICATIONS FOR PROCEDURE:  Patient is a 52 y.o. female who was found during routine screening colonoscopy to have milky white fluid described as pus draining from her appendiceal orifice without significant pain. CT demonstrated thickened dilated appendix with mild peri-appendiceal fat stranding/inflammation. All risks, benefits, and alternatives to appendectomy were discussed with the patient and her husband, all of patient's and family's questions were answered to their expressed satisfaction, and informed consent was obtained and documented.  DETAILS OF PROCEDURE: Patient was brought to the operating suite and appropriately identified. General anesthesia was administered along with confirmation of appropriate pre-operative antibiotics, and endotracheal intubation was performed by anesthetist. In supine position, operative site was prepped and draped in usual sterile fashion, and following a brief time out, initial 5 mm incision was made in a natural skin crease just below the umbilicus. Fascia was then elevated, and a Verress needle was inserted and its proper position confirmed using saline meniscus test prior to abdominal  insufflation.  Upon insufflation of the abdominal cavity with carbon dioxide to a well-tolerated pressure of 12-15 mmHg, a 5 mm peri-umbilical port followed by laparoscope were inserted and used to inspect the abdominal cavity and its contents with no injuries from insertion of the first trochar noted. Two additional trocars were inserted, a 12 mm port at the Left lower quadrant position and another 5 mm port at the suprapubic position. The table was then placed in Trendelenburg position with the Right side up, and blunt graspers were gently used to retract the bowel overlying a moderately dilated but minimally inflamed appendix surrounded by mild-/moderate- peri-appendiceal inflammation without ascites. The appendix was gently retracted by near its tip, taking care to avoid disruption/perforation of the appendix, and the base of the appendix and mesoappendix were identified in relation to the cecum. The mesoappendix was dissected from the visceral appendix and hemostasis was achieved using a harmonic scalpel. Upon freeing the visceral appendix from the mesoappendix, an endostapler loaded with a standard blue tissue load was advanced across the base of the visceral appendix, which was compressed for several seconds, and the stapler was deployed and removed from the abdominal cavity. Hemostasis was confirmed, and the specimen was extracted from the abdominal cavity in a laparoscopic specimen bag.  The intraperitoneal cavity was inspected with no additional findings. PMI laparoscopic fascial closure device was then used to re-approximate fascia at the 12 mm Left lower quadrant port site. All ports were then removed under direct visualization, and the abdominal cavity was desuflated. All port sites were irrigated/cleaned, additional local anesthetic was injected at each incision, 3-0 Vicryl was used to re-approximate dermis at 12 mm port site, and subcuticular 4-0 Monocryl suture was used to re-approximate skin.  Skin was then cleaned, dried, and sterile skin glue was applied. Patient was then safely able to be awakened, extubated, and transferred to PACU for post-operative monitoring and care.  I was present for all aspects of the above procedure, and no operative complications were apparent.

## 2018-05-06 NOTE — Progress Notes (Signed)
Discharge order received. Patient is alert and oriented. Vital signs stable . No signs of acute distress. Discharge instructions given. Patient verbalized understanding. No other issues noted at this time.   

## 2018-05-06 NOTE — Anesthesia Post-op Follow-up Note (Signed)
Anesthesia QCDR form completed.        

## 2018-05-06 NOTE — Transfer of Care (Signed)
Immediate Anesthesia Transfer of Care Note  Patient: Tamara Fox  Procedure(s) Performed: APPENDECTOMY LAPAROSCOPIC (N/A Abdomen)  Patient Location: PACU  Anesthesia Type:General  Level of Consciousness: awake, alert , oriented and patient cooperative  Airway & Oxygen Therapy: Patient Spontanous Breathing  Post-op Assessment: Report given to RN and Post -op Vital signs reviewed and stable  Post vital signs: Reviewed and stable  Last Vitals:  Vitals Value Taken Time  BP 156/88 05/06/2018  4:29 AM  Temp 35.9 C 05/06/2018  4:28 AM  Pulse 71 05/06/2018  4:41 AM  Resp 17 05/06/2018  4:41 AM  SpO2 94 % 05/06/2018  4:41 AM  Vitals shown include unvalidated device data.  Last Pain:  Vitals:   05/06/18 0428  TempSrc:   PainSc: Asleep         Complications: No apparent anesthesia complications

## 2018-05-06 NOTE — Discharge Summary (Signed)
Discharge Summary  Patient ID: Tamara Fox MRN: [id number] DOB/AGE: 1965/11/18 52 y.o.  Admit date: 05/05/2018 Discharge date: 05/06/2018  Discharge Diagnoses Patient Active Problem List   Diagnosis Date Noted  . Acute appendicitis 05/05/2018  . Colon cancer screening   . High serum high density lipoprotein (HDL) 09/21/2017  . Hashimoto's thyroiditis 09/21/2017  . History of renal stone 09/21/2017  . BMI 31.0-31.9,adult 09/21/2017  . 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 Satira Mccallum, MD  HPI: 52 y.[facility]al[facility]icit[facility]. Patientunderwent surveillance colonoscopy this morning (05/05/18) with Dr. Carollee Sires was unremarkable aside from the finding of purulence draining from the appendiceal orifice into the cecum. Following the procedure, she was sent for evaluation with CT which was concerning for appendicitis, and she was referred to the ED. She denied any abdominal pain, nausea, emesis, fevers, chills, or constipation. She denied any history of RLQ abdominal pain in the past that she could recall. She was admitted to general surgery with plans for laparoscopic appendectomy.   Hospital Course: Informed consent was obtained and documented, and patient underwent uneventful laparoscopic appendectomy (Dr. Satira Mccallum, MD, 05/06/2018).  Post-operatively, patient's painimproved/resolved and advancement of patient's diet and ambulation were well-tolerated. The remainder of patient's hospital course was essentially unremarkable, and discharge planning was initiated accordingly with patient safely able to be discharged home with appropriate discharge instructions, pain control, and outpatient follow-up after all of her and her  family's questions were answered to their expressed satisfaction.   Physical Exam:  Constitutional: alert,  cooperative and no distress  HENT: normocephalic without obvious abnormality  Eyes: EOM's grossly intact and symmetric  Respiratory: breathing non-labored at rest  Gastrointestinal: soft, non-tender, and non-distended, laparoscopic incisions are CDI without erythema or drainage Musculoskeletal: no edema or wounds, motor and sensation grossly intact, NT     Allergies as of 05/06/2018   No Known Allergies     Medication List    TAKE these medications   b complex vitamins capsule Take 1 capsule by mouth daily.   buPROPion 150 MG 24 hr tablet Commonly known as:  WELLBUTRIN XL Take 150 mg by mouth daily.   busPIRone 10 MG tablet Commonly known as:  BUSPAR Take 20 mg by mouth 2 (two) times daily.   cholecalciferol 25 MCG (1000 UT) tablet Commonly known as:  VITAMIN D3 Take 2,000 Units by mouth daily.   DULoxetine 60 MG capsule Commonly known as:  CYMBALTA Take 60 mg by mouth daily.   Fish Oil 1000 MG Caps Take 1,000 mg by mouth daily.   hydrOXYzine 50 MG tablet Commonly known as:  ATARAX/VISTARIL Take 50-100 mg by mouth at bedtime as needed (sleep).   levothyroxine 125 MCG tablet Commonly known as:  SYNTHROID, LEVOTHROID Take 1 tablet by mouth daily.   Magnesium 500 MG Caps Take 500 mg by mouth daily.   oxyCODONE 5 MG immediate release tablet Commonly known as:  Oxy IR/ROXICODONE Take 1 tablet (5 mg total) by mouth every 4 (four) hours as needed for severe pain.   RANITIDINE 150 MAX STRENGTH PO Take 150 mg by mouth daily.        Follow-up Information    Ancil Linsey, MD. Schedule an appointment as soon as possible for a visit in 2 week(s).   Specialty:  General Surgery Why:  S/P Lap appy with Dr [name] 05/06/18 Contact information: [address]  [address] [place] [address] [phone]           Signed: Lynden Oxford , PA-C [facility]  05/06/2018, 1:38 PM [phone] M-F: 7am - 4pm

## 2018-05-06 NOTE — Anesthesia Preprocedure Evaluation (Signed)
Anesthesia Evaluation  Patient identified by MRN, date of birth, ID band Patient awake    Reviewed: Allergy & Precautions, H&P , NPO status , Patient's Chart, lab work & pertinent test results  History of Anesthesia Complications Negative for: history of anesthetic complications  Airway Mallampati: II  TM Distance: <3 FB Neck ROM: full    Dental  (+) Chipped   Pulmonary neg pulmonary ROS, neg shortness of breath, former smoker,           Cardiovascular Exercise Tolerance: Good (-) angina(-) Past MI and (-) DOE negative cardio ROS       Neuro/Psych PSYCHIATRIC DISORDERS negative neurological ROS     GI/Hepatic Neg liver ROS, GERD  Medicated and Controlled,  Endo/Other  negative endocrine ROS  Renal/GU Renal disease     Musculoskeletal   Abdominal   Peds  Hematology negative hematology ROS (+)   Anesthesia Other Findings Past Medical History: No date: Anxiety No date: Depression No date: GERD (gastroesophageal reflux disease) No date: Hashimoto's disease No date: Kidney stones  Past Surgical History: 2013 2017: BUNIONECTOMY     Comment:  RightX 2  BMI    Body Mass Index:  31.64 kg/m      Reproductive/Obstetrics negative OB ROS                             Anesthesia Physical Anesthesia Plan  ASA: III  Anesthesia Plan: General ETT   Post-op Pain Management:    Induction: Intravenous  PONV Risk Score and Plan: Ondansetron, Dexamethasone and Midazolam  Airway Management Planned: Oral ETT  Additional Equipment:   Intra-op Plan:   Post-operative Plan: Extubation in OR  Informed Consent: I have reviewed the patients History and Physical, chart, labs and discussed the procedure including the risks, benefits and alternatives for the proposed anesthesia with the patient or authorized representative who has indicated his/her understanding and acceptance.   Dental Advisory  Given  Plan Discussed with: Anesthesiologist, CRNA and Surgeon  Anesthesia Plan Comments: (Patient consented for risks of anesthesia including but not limited to:  - adverse reactions to medications - damage to teeth, lips or other oral mucosa - sore throat or hoarseness - Damage to heart, brain, lungs or loss of life  Patient voiced understanding.)        Anesthesia Quick Evaluation

## 2018-05-06 NOTE — Discharge Instructions (Signed)
In addition to included general post-operative instructions for laparoscopic appendectomy (Appendix Removal),  Diet: Resume home diet.   Activity: No heavy lifting >20 pounds (children, pets, laundry, garbage) or strenuous activity until follow-up, but light activity and walking are encouraged. Do not drive or drink alcohol if taking narcotic pain medications.  Wound care: 2 days after surgery (11/09), you may shower/get incision wet with soapy water and pat dry (do not rub incisions), but no baths or submerging incision underwater until follow-up.   Medications: Resume all home medications. For mild to moderate pain: acetaminophen (Tylenol) or ibuprofen/naproxen (if no kidney disease). Combining Tylenol with alcohol can substantially increase your risk of causing liver disease. Narcotic pain medications, if prescribed, can be used for severe pain, though may cause nausea, constipation, and drowsiness. Do not combine Tylenol and Percocet (or similar) within a 6 hour period as Percocet (and similar) contain(s) Tylenol. If you do not need the narcotic pain medication, you do not need to fill the prescription.  Call office [phone] / [phone]) at any time if any questions, worsening pain, fevers/chills, bleeding, drainage from incision site, or other concerns.

## 2018-05-06 NOTE — Anesthesia Postprocedure Evaluation (Signed)
Anesthesia Post Note  Patient: [name]  Procedure(s) Performed: APPENDECTOMY LAPAROSCOPIC (N/A Abdomen)  Patient location during evaluation: PACU Anesthesia Type: General Level of consciousness: awake and alert Pain management: pain level controlled Vital Signs Assessment: post-procedure vital signs reviewed and stable Respiratory status: spontaneous breathing, nonlabored ventilation, respiratory function stable and patient connected to nasal cannula oxygen Cardiovascular status: blood pressure returned to baseline and stable Postop Assessment: no apparent nausea or vomiting Anesthetic complications: no     Last Vitals:  Vitals:   05/06/18 0511 05/06/18 0529  BP: 121/78 122/75  Pulse: 78 70  Resp: 12 18  Temp: (!) 36.3 C 36.6 C  SpO2: 97% 99%    Last Pain:  Vitals:   05/06/18 1610  TempSrc:   PainSc: Asleep                 Cleda Mccreedy 

## 2018-05-07 LAB — TYPE AND SCREEN
ABO/RH(D): O POS
ANTIBODY SCREEN: POSITIVE
PT AG Type: POSITIVE
UNIT DIVISION: 0
UNIT DIVISION: 0

## 2018-05-07 LAB — SURGICAL PATHOLOGY

## 2018-05-07 LAB — BPAM RBC
Blood Product Expiration Date: 201911292359
Blood Product Expiration Date: 201911292359
UNIT TYPE AND RH: 5100
Unit Type and Rh: 5100

## 2018-05-11 DIAGNOSIS — F330 Major depressive disorder, recurrent, mild: Secondary | ICD-10-CM | POA: Diagnosis not present

## 2018-05-11 DIAGNOSIS — F411 Generalized anxiety disorder: Secondary | ICD-10-CM | POA: Diagnosis not present

## 2018-05-11 DIAGNOSIS — Z79899 Other long term (current) drug therapy: Secondary | ICD-10-CM | POA: Diagnosis not present

## 2018-05-20 ENCOUNTER — Encounter: Payer: Self-pay | Admitting: Surgery

## 2018-05-20 ENCOUNTER — Other Ambulatory Visit: Payer: Self-pay

## 2018-05-20 ENCOUNTER — Ambulatory Visit (INDEPENDENT_AMBULATORY_CARE_PROVIDER_SITE_OTHER): Payer: 59 | Admitting: Surgery

## 2018-05-20 VITALS — BP 130/75 | HR 94 | Temp 97.5°F | Resp 16 | Ht 62.0 in | Wt 170.6 lb

## 2018-05-20 DIAGNOSIS — K3580 Unspecified acute appendicitis: Secondary | ICD-10-CM

## 2018-05-20 DIAGNOSIS — Z4889 Encounter for other specified surgical aftercare: Secondary | ICD-10-CM

## 2018-05-20 NOTE — Progress Notes (Signed)
Surgical Clinic Progress/Follow-up Note   HPI:  52 y.o. Female presents to clinic for post-op follow-up 14 Days s/p laparoscopic appendectomy Tamara Fox([place], 05/06/2018). Patient reports complete resolution of peri-operative pain and has been tolerating regular diet with +flatus and normal BM's, denies N/V, fever/chills, CP, or SOB.  Review of Systems:  Constitutional: denies fever/chills  Respiratory: denies shortness of breath, wheezing  Cardiovascular: denies chest pain, palpitations  Gastrointestinal: abdominal pain, N/V, and bowel function as per interval history Skin: Denies any other rashes or skin discolorations except post-surgical wounds as per interval history  Vital Signs:  BP 130/75   Pulse 94   Temp (!) 97.5 F (36.4 C) (Temporal)   Resp 16   Ht 5\' 2"  (1.575 m)   Wt 170 lb 9.6 oz (77.4 kg)   SpO2 96%   BMI 31.20 kg/m    Physical Exam:  Constitutional:  -- Obese body habitus  -- Awake, alert, and oriented x3  Pulmonary:  -- No crackles -- Equal breath sounds bilaterally -- Breathing non-labored at rest Cardiovascular:  -- S1, S2 present  -- No pericardial rubs  Gastrointestinal:  -- Soft and non-distended, non-tender to palpation, no guarding/rebound tenderness -- Post-surgical incisions all well-approximated without any peri-incisional erythema or drainage -- No abdominal masses appreciated, pulsatile or otherwise  Musculoskeletal / Integumentary:  -- Wounds or skin discoloration: None appreciated except post-surgical incisions as described above (GI) -- Extremities: B/L UE and LE FROM, hands and feet warm   Imaging: No new pertinent imaging available for review  Surgical Pathology (05/06/2018): A. APPENDIX; APPENDECTOMY:  - CHANGES CONSISTENT WITH EARLY ACUTE APPENDICITIS.  - APPENDICEAL TIP WITH NEUROMA EXTENDING INTO DIVERTICULAR OUTPOUCHING  OF APPENDICEAL WALL.  - NEGATIVE FOR ATYPIA AND MALIGNANCY.  Assessment:  52 y.o. yo Female with a problem list  including...  Patient Active Problem List   Diagnosis Date Noted  . Acute appendicitis 05/05/2018  . Colon cancer screening   . High serum high density lipoprotein (HDL) 09/21/2017  . Hashimoto's thyroiditis 09/21/2017  . History of renal stone 09/21/2017  . BMI 31.0-31.9,adult 09/21/2017  . Major depression single episode, in partial remission (HCC) 02/05/2015    presents to clinic for post-op follow-up evaluation, doing well 14 Days s/p laparoscopic appendectomy Tamara Fox(Mix, 05/06/2018) for appendicitis vs appendiceal mucocele.  Plan:   - pathology discussed             - advance diet as tolerated             - okay to submerge incisions under water (baths, swimming) prn             - gradually resume all activities without restrictions over next 2 weeks             - apply sunblock particularly to incisions with sun exposure to reduce pigmentation of scars             - return to clinic as needed, instructed to call office if any questions or concerns  All of the above recommendations were discussed with the patient, and all of patient's questions were answered to her expressed satisfaction.  -- Scherrie GerlachJason E. Tamara Plateravis, MD, RPVI [place]: [facility] General Surgery - Partnering for exceptional care. Office: [phone]

## 2018-05-20 NOTE — Patient Instructions (Signed)

## 2018-07-06 DIAGNOSIS — F411 Generalized anxiety disorder: Secondary | ICD-10-CM | POA: Diagnosis not present

## 2018-07-06 DIAGNOSIS — F330 Major depressive disorder, recurrent, mild: Secondary | ICD-10-CM | POA: Diagnosis not present

## 2018-07-06 DIAGNOSIS — Z79899 Other long term (current) drug therapy: Secondary | ICD-10-CM | POA: Diagnosis not present

## 2018-07-20 ENCOUNTER — Encounter: Payer: Self-pay | Admitting: Internal Medicine

## 2018-07-20 NOTE — Telephone Encounter (Signed)
Please Advise patient messages.

## 2018-09-07 DIAGNOSIS — Z1231 Encounter for screening mammogram for malignant neoplasm of breast: Secondary | ICD-10-CM | POA: Diagnosis not present

## 2018-09-13 ENCOUNTER — Other Ambulatory Visit: Payer: Self-pay | Admitting: Internal Medicine

## 2018-09-30 ENCOUNTER — Ambulatory Visit: Payer: 59 | Admitting: Internal Medicine

## 2018-11-02 DIAGNOSIS — Z79899 Other long term (current) drug therapy: Secondary | ICD-10-CM | POA: Diagnosis not present

## 2018-11-02 DIAGNOSIS — F411 Generalized anxiety disorder: Secondary | ICD-10-CM | POA: Diagnosis not present

## 2018-11-02 DIAGNOSIS — F3342 Major depressive disorder, recurrent, in full remission: Secondary | ICD-10-CM | POA: Diagnosis not present

## 2018-11-16 DIAGNOSIS — H5203 Hypermetropia, bilateral: Secondary | ICD-10-CM | POA: Diagnosis not present

## 2018-11-16 DIAGNOSIS — Z135 Encounter for screening for eye and ear disorders: Secondary | ICD-10-CM | POA: Diagnosis not present

## 2018-11-16 DIAGNOSIS — H524 Presbyopia: Secondary | ICD-10-CM | POA: Diagnosis not present

## 2019-02-08 DIAGNOSIS — F411 Generalized anxiety disorder: Secondary | ICD-10-CM | POA: Diagnosis not present

## 2019-02-08 DIAGNOSIS — Z79899 Other long term (current) drug therapy: Secondary | ICD-10-CM | POA: Diagnosis not present

## 2019-02-08 DIAGNOSIS — F3342 Major depressive disorder, recurrent, in full remission: Secondary | ICD-10-CM | POA: Diagnosis not present

## 2019-02-09 ENCOUNTER — Encounter: Payer: Self-pay | Admitting: Internal Medicine

## 2019-04-05 ENCOUNTER — Ambulatory Visit (INDEPENDENT_AMBULATORY_CARE_PROVIDER_SITE_OTHER): Payer: PRIVATE HEALTH INSURANCE | Admitting: Internal Medicine

## 2019-04-05 ENCOUNTER — Encounter: Payer: Self-pay | Admitting: Internal Medicine

## 2019-04-05 ENCOUNTER — Other Ambulatory Visit: Payer: Self-pay

## 2019-04-05 VITALS — BP 122/88 | HR 76 | Ht 62.0 in | Wt 166.0 lb

## 2019-04-05 DIAGNOSIS — Z23 Encounter for immunization: Secondary | ICD-10-CM

## 2019-04-05 DIAGNOSIS — E042 Nontoxic multinodular goiter: Secondary | ICD-10-CM | POA: Insufficient documentation

## 2019-04-05 DIAGNOSIS — Z1231 Encounter for screening mammogram for malignant neoplasm of breast: Secondary | ICD-10-CM | POA: Diagnosis not present

## 2019-04-05 DIAGNOSIS — E038 Other specified hypothyroidism: Secondary | ICD-10-CM

## 2019-04-05 DIAGNOSIS — E063 Autoimmune thyroiditis: Secondary | ICD-10-CM

## 2019-04-05 DIAGNOSIS — F324 Major depressive disorder, single episode, in partial remission: Secondary | ICD-10-CM

## 2019-04-05 DIAGNOSIS — R400 Somnolence: Secondary | ICD-10-CM

## 2019-04-05 DIAGNOSIS — Z0000 Encounter for general adult medical examination without abnormal findings: Secondary | ICD-10-CM | POA: Diagnosis not present

## 2019-04-05 LAB — POCT URINALYSIS DIPSTICK
Appearance: NEGATIVE
Bilirubin, UA: NEGATIVE
Blood, UA: NEGATIVE
Glucose, UA: NEGATIVE
Ketones, UA: NEGATIVE
Leukocytes, UA: NEGATIVE
Nitrite, UA: 0.2
Protein, UA: NEGATIVE
Spec Grav, UA: 1.015 (ref 1.010–1.025)
pH, UA: 6 (ref 5.0–8.0)

## 2019-04-05 MED ORDER — LEVOTHYROXINE SODIUM 125 MCG PO TABS: ORAL_TABLET | ORAL | 1 refills | Status: DC

## 2019-04-05 NOTE — Progress Notes (Signed)
Date:  04/05/2019   Name:  Tamara Fox   DOB:  June 07, 1966   MRN:  [id number]   Chief Complaint: Annual Exam (Breast Exam. Pap next year. Flu shot.) Tamara Fox is a 53 y.o. female who presents today for her Complete Annual Exam. She feels fairly well. She reports exercising walking. She reports she is sleeping poorly. She snores and wakes up feeling tired/not refreshed. She also has frequent waking.  Mammogram 08/2018 Colonoscopy 2019 Pap 2019 - normal thin prep  Thyroid Problem Presents for follow-up visit. Symptoms include depressed mood and fatigue. Patient reports no anxiety, constipation, diarrhea, leg swelling, palpitations, tremors or weight gain. The symptoms have been stable.  Depression        This is a chronic problem.  Episode frequency: Psychiatrist wants her to have a sleep study.The problem is unchanged.  Associated symptoms include fatigue.  Associated symptoms include no headaches.  Past medical history includes thyroid problem.    Lab Results  Component Value Date   TSH 0.558 03/30/2018   Lab Results  Component Value Date   CREATININE 0.85 05/05/2018   BUN 12 05/05/2018   NA 141 05/05/2018   K 3.7 05/05/2018   CL 105 05/05/2018   CO2 28 05/05/2018   Lab Results  Component Value Date   CHOL 207 (H) 03/30/2018   HDL 78 03/30/2018   LDLCALC 112 (H) 03/30/2018   TRIG 83 03/30/2018   CHOLHDL 2.7 03/30/2018     Review of Systems  Constitutional: Positive for fatigue. Negative for chills, fever and weight gain.  HENT: Negative for congestion, hearing loss, tinnitus, trouble swallowing and voice change.   Eyes: Negative for visual disturbance.  Respiratory: Negative for cough, chest tightness, shortness of breath and wheezing.   Cardiovascular: Negative for chest pain, palpitations and leg swelling.  Gastrointestinal: Negative for abdominal pain, constipation, diarrhea and vomiting.  Endocrine: Negative for polydipsia and polyuria.  Genitourinary:  Negative for dysuria, frequency, genital sores, vaginal bleeding and vaginal discharge.  Musculoskeletal: Negative for arthralgias, gait problem and joint swelling.  Skin: Negative for color change and rash.  Neurological: Negative for dizziness, tremors, light-headedness and headaches.  Hematological: Negative for adenopathy. Does not bruise/bleed easily.  Psychiatric/Behavioral: Positive for depression, dysphoric mood and sleep disturbance. The patient is not nervous/anxious.     Patient Active Problem List   Diagnosis Date Noted  . Multinodular goiter 04/05/2019  . Hypothyroidism due to Hashimoto's thyroiditis 04/05/2019  . High serum high density lipoprotein (HDL) 09/21/2017  . History of renal stone 09/21/2017  . BMI 31.0-31.9,adult 09/21/2017  . Major depression single episode, in partial remission ([place]) 02/05/2015    No Known Allergies  Past Surgical History:  Procedure Laterality Date  . BUNIONECTOMY  2013 2017   RightX 2  . COLONOSCOPY WITH PROPOFOL N/A 05/05/2018   Procedure: COLONOSCOPY WITH PROPOFOL;  Surgeon: Lin Landsman, MD;  Location: [facility];  Service: Endoscopy;  Laterality: N/A;  . LAPAROSCOPIC APPENDECTOMY N/A 05/05/2018   Procedure: APPENDECTOMY LAPAROSCOPIC;  Surgeon: Vickie Epley, MD;  Location: [facility];  Service: General;  Laterality: N/A;    Social History   Tobacco Use  . Smoking status: Former Smoker    Packs/day: 0.50    Years: 20.00    Pack years: 10.00    Quit date: 09/30/2002    Years since quitting: 16.5  . Smokeless tobacco: Never Used  Substance Use Topics  . Alcohol use: Yes    Alcohol/week: 7.0 standard drinks  Types: 7 Glasses of wine per week    Comment:    . Drug use: Never     Medication list has been reviewed and updated.  Current Meds  Medication Sig  . b complex vitamins capsule Take 1 capsule by mouth daily.  . Biotin 1 MG CAPS Take by mouth.  Marland Kitchen. buPROPion (WELLBUTRIN XL) 150 MG 24 hr tablet Take  150 mg by mouth daily.  . busPIRone (BUSPAR) 10 MG tablet Take 20 mg by mouth 2 (two) times daily.   . cholecalciferol 25 MCG (1000 UT) TABS Take 2,000 Units by mouth daily.   . DULoxetine (CYMBALTA) 60 MG capsule Take 60 mg by mouth daily.  . hydrOXYzine (ATARAX/VISTARIL) 50 MG tablet Take 50-100 mg by mouth at bedtime as needed (sleep).   Marland Kitchen. levothyroxine (SYNTHROID, LEVOTHROID) 125 MCG tablet TAKE 1 TABLET BY MOUTH ONCE DAILY  . Magnesium 500 MG CAPS Take 750 mg by mouth daily.   . Misc Natural Products (OSTEO BI-FLEX/5-LOXIN ADVANCED PO) Take by mouth.  . Omega-3 Fatty Acids (FISH OIL) 1000 MG CAPS Take 1,000 mg by mouth daily.   Marland Kitchen. omeprazole (PRILOSEC) 10 MG capsule Take 10 mg by mouth daily.    PHQ 2/9 Scores 04/05/2019 03/30/2018  PHQ - 2 Score 0 0  PHQ- 9 Score 4 0    BP Readings from Last 3 Encounters:  04/05/19 122/88  05/20/18 130/75  05/06/18 124/69   Results of the Epworth flowsheet 04/05/2019  Sitting and reading 2  Watching TV 1  Sitting, inactive in a public place (e.g. a theatre or a meeting) 2  As a passenger in a car for an hour without a break 3  Lying down to rest in the afternoon when circumstances permit 3  Sitting and talking to someone 0  Sitting quietly after a lunch without alcohol 1  In a car, while stopped for a few minutes in traffic 1  Total score 13     Physical Exam Vitals signs and nursing note reviewed.  Constitutional:      General: She is not in acute distress.    Appearance: She is well-developed.  HENT:     Head: Normocephalic and atraumatic.     Right Ear: Tympanic membrane and ear canal normal.     Left Ear: Tympanic membrane and ear canal normal.     Nose:     Right Sinus: No maxillary sinus tenderness.     Left Sinus: No maxillary sinus tenderness.  Eyes:     General: No scleral icterus.       Right eye: No discharge.        Left eye: No discharge.     Conjunctiva/sclera: Conjunctivae normal.  Neck:     Musculoskeletal:  Normal range of motion. No erythema.     Thyroid: No thyromegaly.     Vascular: No carotid bruit.  Cardiovascular:     Rate and Rhythm: Normal rate and regular rhythm.     Pulses: Normal pulses.     Heart sounds: Normal heart sounds.  Pulmonary:     Effort: Pulmonary effort is normal. No respiratory distress.     Breath sounds: No wheezing.  Chest:     Breasts:        Right: No mass, nipple discharge, skin change or tenderness.        Left: No mass, nipple discharge, skin change or tenderness.  Abdominal:     General: Bowel sounds are normal.  Palpations: Abdomen is soft.     Tenderness: There is no abdominal tenderness.  Musculoskeletal: Normal range of motion.     Right lower leg: No edema.     Left lower leg: No edema.  Lymphadenopathy:     Cervical: No cervical adenopathy.  Skin:    General: Skin is warm and dry.     Capillary Refill: Capillary refill takes less than 2 seconds.     Findings: No rash.  Neurological:     General: No focal deficit present.     Mental Status: She is alert and oriented to person, place, and time.     Cranial Nerves: No cranial nerve deficit.     Sensory: No sensory deficit.     Deep Tendon Reflexes: Reflexes are normal and symmetric.  Psychiatric:        Speech: Speech normal.        Behavior: Behavior normal.        Thought Content: Thought content normal.     Wt Readings from Last 3 Encounters:  04/05/19 166 lb (75.3 kg)  05/20/18 170 lb 9.6 oz (77.4 kg)  05/05/18 173 lb (78.5 kg)    BP 122/88   Pulse 76   Ht 5\' 2"  (1.575 m)   Wt 166 lb (75.3 kg)   SpO2 97%   BMI 30.36 kg/m   Assessment and Plan: 1. Annual physical exam Normal exam Continue healthy diet and exercise - CBC with Differential/Platelet - Comprehensive metabolic panel - Lipid panel - POCT urinalysis dipstick  2. Encounter for screening mammogram for breast cancer To be scheduled in march at [facility] by patient - MM 3D SCREEN BREAST BILATERAL; Future   3. Major depression single episode, in partial remission (HCC) Followed by Psychiatry and doing fairly well on bupropion, buspar, cymbalta and vistaril. No SI or HI noted.    4. Hypothyroidism due to Hashimoto's thyroiditis supplemented - TSH + free T4 - levothyroxine (SYNTHROID) 125 MCG tablet; TAKE 1 TABLET BY MOUTH ONCE DAILY  Dispense: 90 tablet; Refill: 1  5. Daytime somnolence Needs sleep study for s/s of sleep disorder - Home sleep test  6. Need for immunization against influenza - Flu Vaccine QUAD 36+ mos IM   Partially dictated using [facility]. Any errors are unintentional.  Animal nutritionist, MD [facility]  04/05/2019

## 2019-04-06 LAB — LIPID PANEL
Chol/HDL Ratio: 2.7 ratio (ref 0.0–4.4)
Cholesterol, Total: 207 mg/dL — ABNORMAL HIGH (ref 100–199)
HDL: 77 mg/dL (ref >39)
LDL Chol Calc (NIH): 109 mg/dL — ABNORMAL HIGH (ref 0–99)
Triglycerides: 119 mg/dL (ref 0–149)
VLDL Cholesterol Cal: 21 mg/dL (ref 5–40)

## 2019-04-06 LAB — COMPREHENSIVE METABOLIC PANEL
ALT: 23 IU/L (ref 0–32)
AST: 26 IU/L (ref 0–40)
Albumin/Globulin Ratio: 1.9 (ref 1.2–2.2)
Albumin: 4.6 g/dL (ref 3.8–4.9)
Alkaline Phosphatase: 84 IU/L (ref 39–117)
BUN/Creatinine Ratio: 15 (ref 9–23)
BUN: 13 mg/dL (ref 6–24)
Bilirubin Total: 0.3 mg/dL (ref 0.0–1.2)
CO2: 27 mmol/L (ref 20–29)
Calcium: 9.6 mg/dL (ref 8.7–10.2)
Chloride: 102 mmol/L (ref 96–106)
Creatinine, Ser: 0.88 mg/dL (ref 0.57–1.00)
GFR calc Af Amer: 88 mL/min/{1.73_m2} (ref >59)
GFR calc non Af Amer: 76 mL/min/{1.73_m2} (ref >59)
Globulin, Total: 2.4 g/dL (ref 1.5–4.5)
Glucose: 91 mg/dL (ref 65–99)
Potassium: 4.4 mmol/L (ref 3.5–5.2)
Sodium: 140 mmol/L (ref 134–144)
Total Protein: 7 g/dL (ref 6.0–8.5)

## 2019-04-06 LAB — CBC WITH DIFFERENTIAL/PLATELET
Basophils Absolute: 0.1 10*3/uL (ref 0.0–0.2)
Basos: 1 %
EOS (ABSOLUTE): 0.1 10*3/uL (ref 0.0–0.4)
Eos: 2 %
Hematocrit: 41.5 % (ref 34.0–46.6)
Hemoglobin: 13.8 g/dL (ref 11.1–15.9)
Immature Grans (Abs): 0 10*3/uL (ref 0.0–0.1)
Immature Granulocytes: 0 %
Lymphocytes Absolute: 2.2 10*3/uL (ref 0.7–3.1)
Lymphs: 38 %
MCH: 29.8 pg (ref 26.6–33.0)
MCHC: 33.3 g/dL (ref 31.5–35.7)
MCV: 90 fL (ref 79–97)
Monocytes Absolute: 0.6 10*3/uL (ref 0.1–0.9)
Monocytes: 10 %
Neutrophils Absolute: 2.8 10*3/uL (ref 1.4–7.0)
Neutrophils: 49 %
Platelets: 239 10*3/uL (ref 150–450)
RBC: 4.63 x10E6/uL (ref 3.77–5.28)
RDW: 12.9 % (ref 11.7–15.4)
WBC: 5.8 10*3/uL (ref 3.4–10.8)

## 2019-04-06 LAB — TSH+FREE T4
Free T4: 0.9 ng/dL (ref 0.82–1.77)
TSH: 3.66 u[IU]/mL (ref 0.450–4.500)

## 2019-04-28 ENCOUNTER — Encounter: Payer: Self-pay | Admitting: Internal Medicine

## 2019-04-28 DIAGNOSIS — G4733 Obstructive sleep apnea (adult) (pediatric): Secondary | ICD-10-CM | POA: Insufficient documentation

## 2019-05-05 ENCOUNTER — Encounter: Payer: Self-pay | Admitting: Internal Medicine

## 2019-05-31 ENCOUNTER — Ambulatory Visit (INDEPENDENT_AMBULATORY_CARE_PROVIDER_SITE_OTHER): Payer: PRIVATE HEALTH INSURANCE

## 2019-05-31 ENCOUNTER — Other Ambulatory Visit: Payer: Self-pay

## 2019-05-31 DIAGNOSIS — Z23 Encounter for immunization: Secondary | ICD-10-CM

## 2019-06-27 ENCOUNTER — Encounter: Payer: Self-pay | Admitting: Internal Medicine

## 2019-06-27 ENCOUNTER — Other Ambulatory Visit: Payer: Self-pay | Admitting: Internal Medicine

## 2019-06-27 DIAGNOSIS — F324 Major depressive disorder, single episode, in partial remission: Secondary | ICD-10-CM

## 2019-06-27 MED ORDER — HYDROXYZINE HCL 50 MG PO TABS: 50.0000 mg | ORAL_TABLET | Freq: Every evening | ORAL | 0 refills | Status: DC | PRN

## 2019-06-27 MED ORDER — DULOXETINE HCL 60 MG PO CPEP: 60.0000 mg | ORAL_CAPSULE | Freq: Every day | ORAL | 0 refills | Status: DC

## 2019-06-27 MED ORDER — BUPROPION HCL ER (XL) 150 MG PO TB24: 150.0000 mg | ORAL_TABLET | Freq: Every day | ORAL | 0 refills | Status: DC

## 2019-06-27 MED ORDER — BUSPIRONE HCL 10 MG PO TABS: 20.0000 mg | ORAL_TABLET | Freq: Two times a day (BID) | ORAL | 0 refills | Status: DC

## 2019-07-19 ENCOUNTER — Encounter: Payer: Self-pay | Admitting: Internal Medicine

## 2019-07-19 ENCOUNTER — Other Ambulatory Visit: Payer: Self-pay | Admitting: Internal Medicine

## 2019-07-19 ENCOUNTER — Ambulatory Visit (INDEPENDENT_AMBULATORY_CARE_PROVIDER_SITE_OTHER): Payer: PRIVATE HEALTH INSURANCE | Admitting: Internal Medicine

## 2019-07-19 VITALS — BP 120/72 | HR 83 | Temp 98.0°F | Ht 62.0 in | Wt 170.0 lb

## 2019-07-19 DIAGNOSIS — R0789 Other chest pain: Secondary | ICD-10-CM

## 2019-07-19 DIAGNOSIS — G4733 Obstructive sleep apnea (adult) (pediatric): Secondary | ICD-10-CM

## 2019-07-19 DIAGNOSIS — F324 Major depressive disorder, single episode, in partial remission: Secondary | ICD-10-CM

## 2019-07-19 NOTE — Progress Notes (Signed)
Date:  07/19/2019   Name:  Tamara Fox   DOB:  1966-05-20   MRN:  [id number]   Chief Complaint: cpap (Follow up for insurance.) OSA - pt completed 2 home sleep tests in October 2020.  AHI=20 so auto CPAP ordered and started in December.  She is here for follow up in the 30-90 day window and needs documentation sent to [place] for insurance purposes. Avg use 5 hr/day Days > 4 hours 25/30 Days > 6 hours 13/30 She is tolerating the device well.  She frequently gets mask leak and will have to remove it.  Sleep is restful but not much different.  However, her BP is improved and she feels that she is mentally clearer.  Chest Pain  This is a new problem. The current episode started in the past 7 days. The problem has been unchanged. The pain is present in the lateral region. The pain is mild. The quality of the pain is described as stabbing. The pain does not radiate. Pertinent negatives include no abdominal pain, cough, diaphoresis, dizziness, fever, headaches, palpitations or shortness of breath. The pain is aggravated by breathing and coughing. She has tried nothing for the symptoms.    Lab Results  Component Value Date   CREATININE 0.88 04/05/2019   BUN 13 04/05/2019   NA 140 04/05/2019   K 4.4 04/05/2019   CL 102 04/05/2019   CO2 27 04/05/2019   Lab Results  Component Value Date   CHOL 207 (H) 04/05/2019   HDL 77 04/05/2019   LDLCALC 109 (H) 04/05/2019   TRIG 119 04/05/2019   CHOLHDL 2.7 04/05/2019   Lab Results  Component Value Date   TSH 3.660 04/05/2019   Lab Results  Component Value Date   HGBA1C 4.8 03/30/2018     Review of Systems  Constitutional: Negative for chills, diaphoresis and fever.  Respiratory: Negative for cough, chest tightness, shortness of breath and wheezing.   Cardiovascular: Negative for chest pain and palpitations.  Gastrointestinal: Negative for abdominal pain, constipation and diarrhea.  Neurological: Negative for dizziness, light-headedness  and headaches.    Patient Active Problem List   Diagnosis Date Noted  . OSA (obstructive sleep apnea) 04/28/2019  . Multinodular goiter 04/05/2019  . Hypothyroidism due to Hashimoto's thyroiditis 04/05/2019  . High serum high density lipoprotein (HDL) 09/21/2017  . History of renal stone 09/21/2017  . BMI 31.0-31.9,adult 09/21/2017  . 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 BUNIONECTOMY  2013 2017   RightX 2  . COLONOSCOPY WITH PROPOFOL N/A 05/05/2018   Procedure: COLONOSCOPY WITH PROPOFOL;  Surgeon: Toney Reil, MD;  Location: [facility];  Service: Endoscopy;  Laterality: N/A;  . LAPAROSCOPIC APPENDECTOMY N/A 05/05/2018   Procedure: APPENDECTOMY LAPAROSCOPIC;  Surgeon: Ancil Linsey, MD;  Location: [facility];  Service: General;  Laterality: N/A;    Social History   Tobacco Use  . Smoking status: Former Smoker    Packs/day: 0.50    Years: 20.00    Pack years: 10.00    Quit date: 09/30/2002    Years since quitting: 16.8  . Smokeless tobacco: Never Used  Substance Use Topics  . Alcohol use: Yes    Alcohol/week: 7.0 standard drinks    Types: 7 Glasses of wine per week    Comment:    . Drug use: Never     Medication list has been  reviewed and updated.  Current Meds  Medication Sig  . b complex vitamins capsule Take 1 capsule by mouth daily.  . Biotin 1 MG CAPS Take by mouth.  Marland Kitchen buPROPion (WELLBUTRIN XL) 150 MG 24 hr tablet TAKE 1 TABLET BY MOUTH EVERY DAY  . busPIRone (BUSPAR) 10 MG tablet TAKE 2 TABLETS (20 MG TOTAL) BY MOUTH 2 (TWO) TIMES DAILY.  . cholecalciferol 25 MCG (1000 UT) TABS Take 2,000 Units by mouth daily.   . DULoxetine (CYMBALTA) 60 MG capsule Take 1 capsule (60 mg total) by mouth daily.  . hydrOXYzine (ATARAX/VISTARIL) 50 MG tablet Take 1-2 tablets (50-100 mg total) by mouth at bedtime as needed (sleep).  Marland Kitchen levothyroxine  (SYNTHROID) 125 MCG tablet TAKE 1 TABLET BY MOUTH ONCE DAILY  . Magnesium 500 MG CAPS Take 750 mg by mouth daily.   . Omega-3 Fatty Acids (FISH OIL) 1000 MG CAPS Take 1,000 mg by mouth daily.   Marland Kitchen omeprazole (PRILOSEC) 10 MG capsule Take 10 mg by mouth daily.    PHQ 2/9 Scores 07/19/2019 04/05/2019 03/30/2018  PHQ - 2 Score 0 0 0  PHQ- 9 Score 0 4 0    BP Readings from Last 3 Encounters:  07/19/19 120/72  04/05/19 122/88  05/20/18 130/75    Physical Exam Vitals and nursing note reviewed.  Constitutional:      General: She is not in acute distress.    Appearance: Normal appearance. She is well-developed.  HENT:     Head: Normocephalic and atraumatic.  Cardiovascular:     Rate and Rhythm: Normal rate and regular rhythm.     Pulses: Normal pulses.     Heart sounds: Normal heart sounds. No murmur.  Pulmonary:     Effort: Pulmonary effort is normal. No respiratory distress.     Breath sounds: Normal breath sounds and air entry. No wheezing or rhonchi.  Chest:    Musculoskeletal:        General: Normal range of motion.     Cervical back: Normal range of motion.     Right lower leg: No edema.     Left lower leg: No edema.  Lymphadenopathy:     Cervical: No cervical adenopathy.  Skin:    General: Skin is warm and dry.     Capillary Refill: Capillary refill takes less than 2 seconds.     Findings: No rash.  Neurological:     General: No focal deficit present.     Mental Status: She is alert and oriented to person, place, and time.  Psychiatric:        Behavior: Behavior normal.        Thought Content: Thought content normal.     Wt Readings from Last 3 Encounters:  07/19/19 170 lb (77.1 kg)  04/05/19 166 lb (75.3 kg)  05/20/18 170 lb 9.6 oz (77.4 kg)    BP 120/72   Pulse 83   Temp 98 F (36.7 C) (Oral)   Ht 5\' 2"  (1.575 m)   Wt 170 lb (77.1 kg)   SpO2 98%   BMI 31.09 kg/m   Assessment and Plan: 1. OSA (obstructive sleep apnea) Doing well with CPAP auto  titration Encourage use nightly for as many hours as tolerated Will send this note and her sleep report to [facility]  2. Acute chest wall pain Appears to be muscular and should resolve without issues May use heat, nsaids as needed   Partially dictated using [place]. Any errors are unintentional.  Animal nutritionist  Army Melia, [facility]  07/19/2019

## 2019-08-29 ENCOUNTER — Other Ambulatory Visit: Payer: Self-pay

## 2019-08-29 ENCOUNTER — Ambulatory Visit (INDEPENDENT_AMBULATORY_CARE_PROVIDER_SITE_OTHER): Payer: PRIVATE HEALTH INSURANCE

## 2019-08-29 DIAGNOSIS — Z23 Encounter for immunization: Secondary | ICD-10-CM

## 2019-09-28 ENCOUNTER — Other Ambulatory Visit: Payer: Self-pay | Admitting: Internal Medicine

## 2019-09-28 DIAGNOSIS — E063 Autoimmune thyroiditis: Secondary | ICD-10-CM

## 2019-09-28 DIAGNOSIS — E038 Other specified hypothyroidism: Secondary | ICD-10-CM

## 2020-01-17 IMAGING — CT CT ABD-PELV W/ CM
1 of 3 series · 13 of 32 positions shown, 18 images · IV contrast (APPLIED)
Comparison: None.

CLINICAL DATA: Patient status post colonoscopy today. Pus was
identified about the appendix during the examination. The patient
has no current complaints.

EXAM:
CT ABDOMEN AND PELVIS WITH CONTRAST
TECHNIQUE: Multidetector CT imaging of the abdomen and pelvis was performed
using the standard protocol following bolus administration of
intravenous contrast.
CONTRAST:  100 mL QMV9E1-ATT IOPAMIDOL (QMV9E1-ATT) INJECTION 61%

[Series 2: axial st · axial · 0.71mm/px · z∈[-936,-531]mm · 13 of 91 slices shown, 18 images]
[im 5/91  soft-tissue]
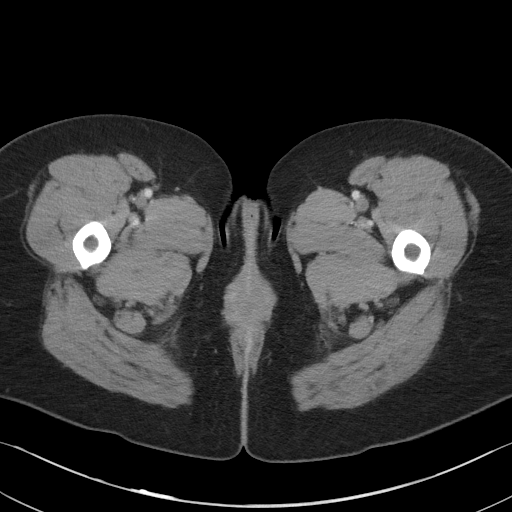
[im 5/91  bone]
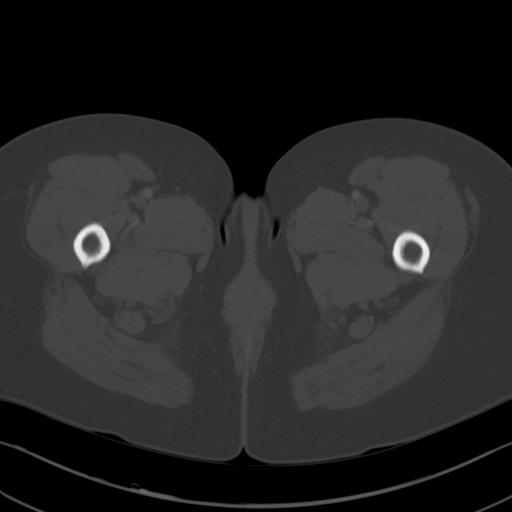
[im 15/91  soft-tissue]
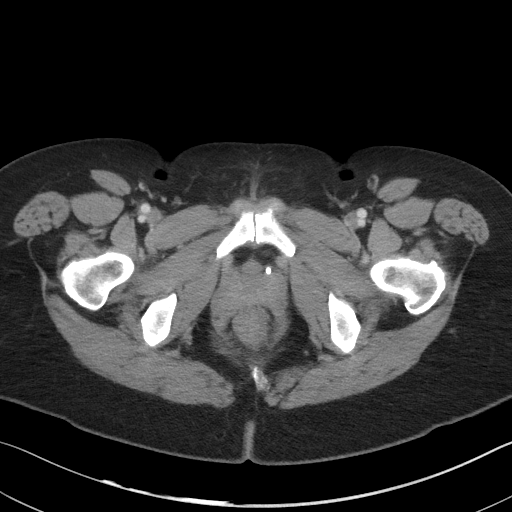
[im 19/91  soft-tissue]
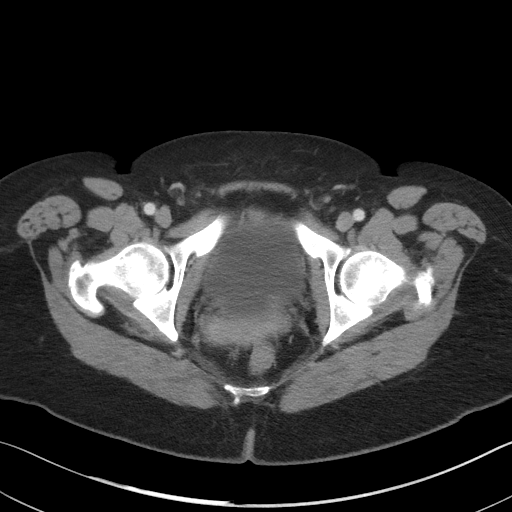
[im 29/91  soft-tissue]
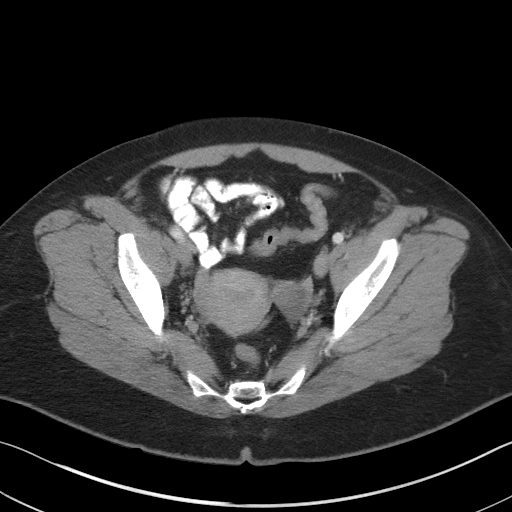
[im 34/91  soft-tissue]
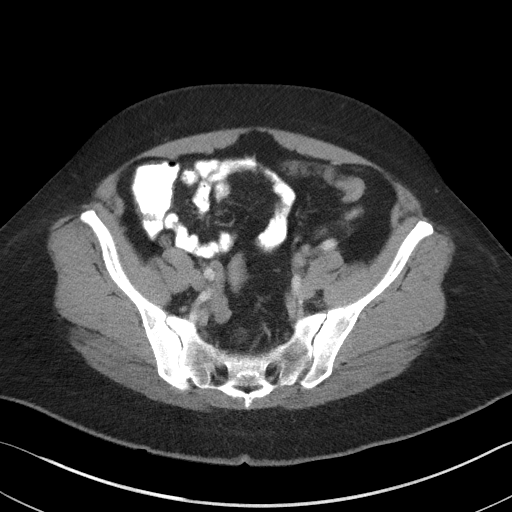
[im 43/91  soft-tissue]
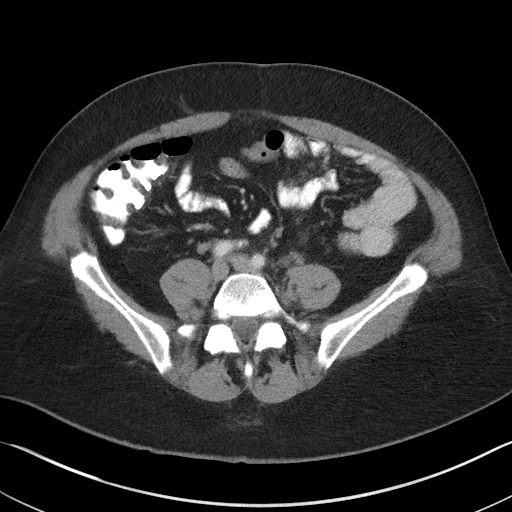
[im 48/91  soft-tissue]
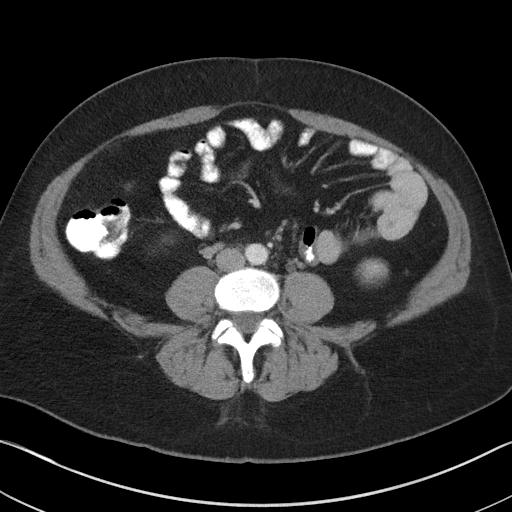
[im 57/91  soft-tissue]
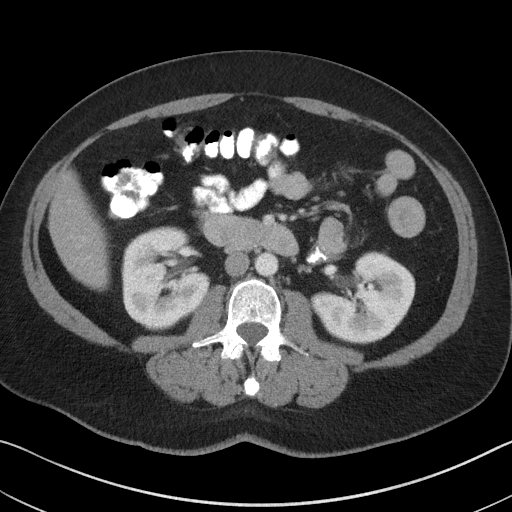
[im 62/91  soft-tissue]
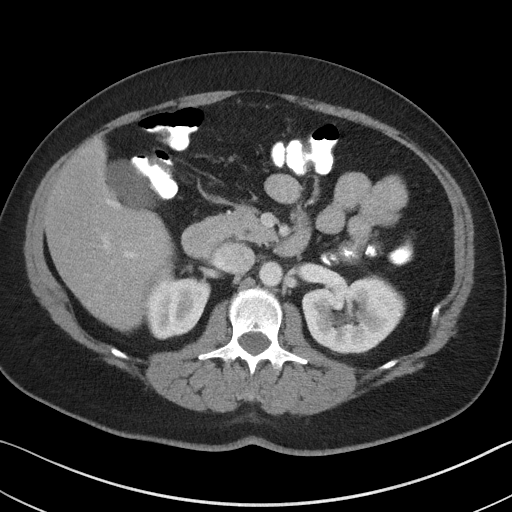
[im 62/91  bone]
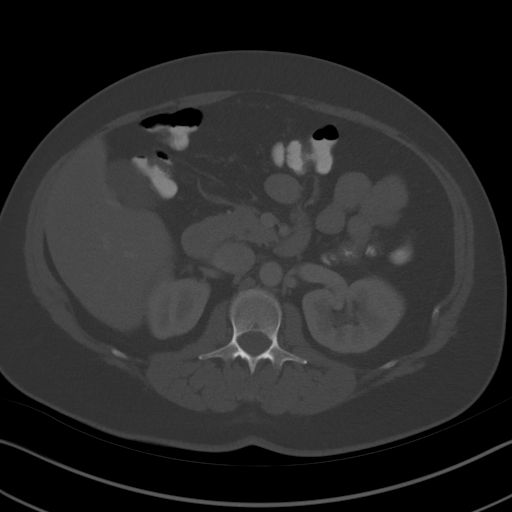
[im 72/91  soft-tissue]
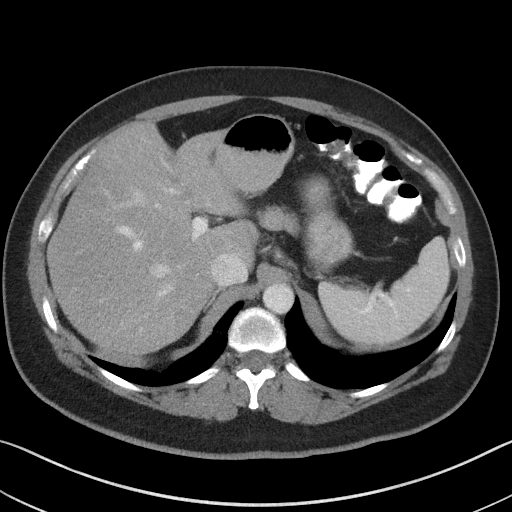
[im 72/91  lung]
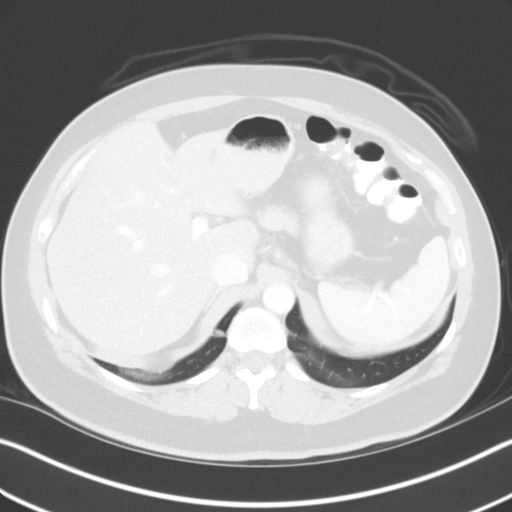
[im 76/91  soft-tissue]
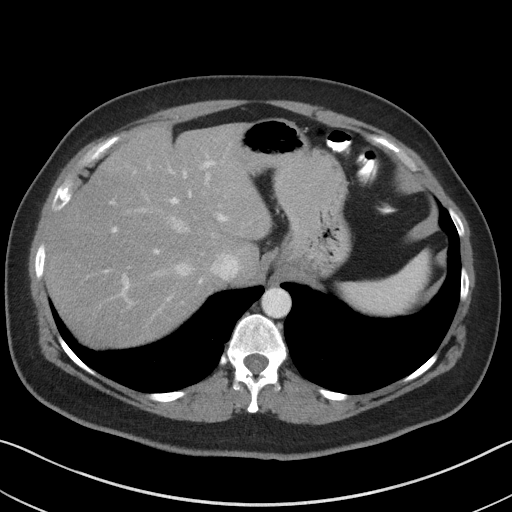
[im 76/91  lung]
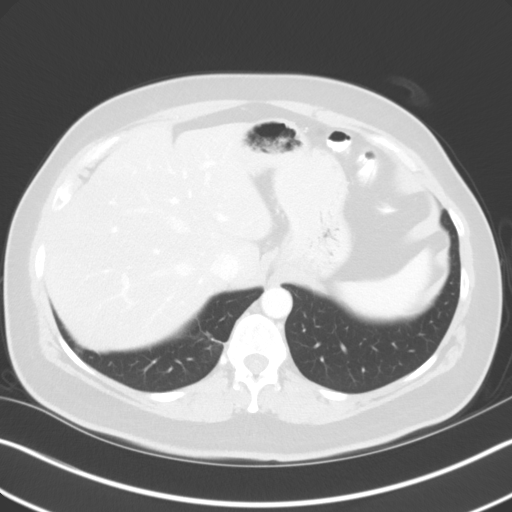
[im 81/91  lung]
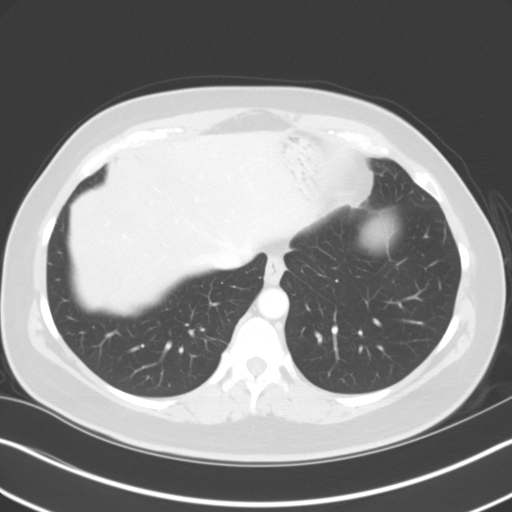
[im 86/91  soft-tissue]
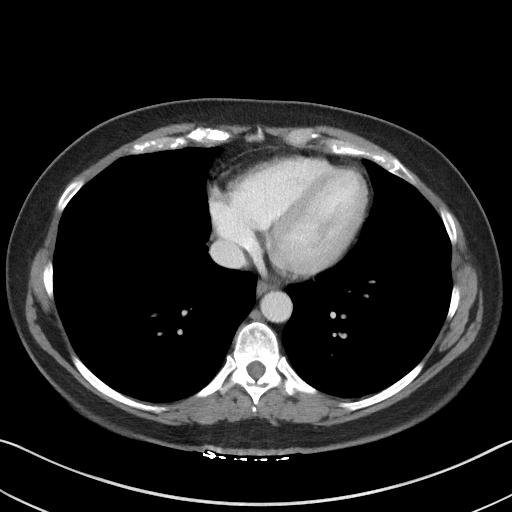
[im 86/91  lung]
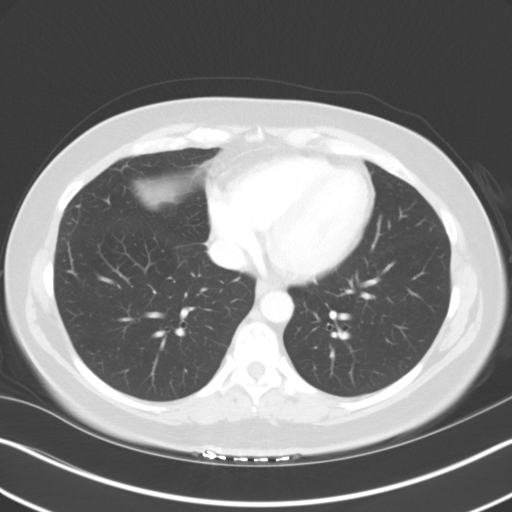

[13 of 32 positions shown; findings below may reference images not displayed]

FINDINGS: Lower chest: Lung bases are clear. No pleural or pericardial
effusion.

Hepatobiliary: The liver is low attenuating consistent with fatty
infiltration. No focal liver lesion. A 1 cm stone is identified in
the gallbladder but there is no CT evidence of cholecystitis.
Biliary tree appears normal.

Pancreas: Unremarkable. No pancreatic ductal dilatation or
surrounding inflammatory changes.

Spleen: Normal in size without focal abnormality.

Adrenals/Urinary Tract: The adrenal glands appear normal. A 0.7 cm
nonobstructing stone is seen in the lower pole of the right kidney.
The kidneys are otherwise unremarkable. Ureters and urinary bladder
appear normal.

Stomach/Bowel: There is mild stranding about the appendix and the
appendix is dilated at 1.2 cm. There is some fatty infiltration of
the walls of the appendix suggestive of chronic inflammatory change.
No appendicolith is seen. No abscess. The stomach, small bowel and
colon appear normal.

Vascular/Lymphatic: No significant vascular findings are present. No
enlarged abdominal or pelvic lymph nodes.

Reproductive: Uterus and bilateral adnexa are unremarkable.

Other: None.

Musculoskeletal: No fracture or worrisome lesion. Degenerative disc
disease L5-S1 noted.
IMPRESSION: Findings consistent with appendicitis. Fatty infiltration of the
walls of the appendix is compatible with prior bouts of
inflammation.

0.7 cm nonobstructing stone lower pole right kidney.

Single 1 cm gallstone without evidence of cholecystitis.

Fatty infiltration of the liver.

These results were called by telephone at the time of interpretation
on 05/05/2018 at [DATE] to Dr. NORELYS NARVEZ , who verbally
acknowledged these results.

## 2020-03-29 ENCOUNTER — Other Ambulatory Visit: Payer: Self-pay | Admitting: Internal Medicine

## 2020-03-29 DIAGNOSIS — E038 Other specified hypothyroidism: Secondary | ICD-10-CM

## 2020-03-29 NOTE — Telephone Encounter (Signed)
Requested Prescriptions  Pending Prescriptions Disp Refills  . levothyroxine (SYNTHROID) 125 MCG tablet [Pharmacy Med Name: LEVOTHYROXINE 125 MCG TABLET] 90 tablet 1    Sig: TAKE 1 TABLET BY MOUTH EVERY DAY     Endocrinology:  Hypothyroid Agents Failed - 03/29/2020  1:27 AM      Failed - TSH needs to be rechecked within 3 months after an abnormal result. Refill until TSH is due.      Passed - TSH in normal range and within 360 days    TSH  Date Value Ref Range Status  04/05/2019 3.660 0.450 - 4.500 uIU/mL Final         Passed - Valid encounter within last 12 months    Recent Outpatient Visits          8 months ago OSA (obstructive sleep apnea)   [facility] Reubin Milan, MD   11 months ago Annual physical exam   [facility] Reubin Milan, MD   2 years ago Annual physical exam   [facility] Reubin Milan, MD   2 years ago Hashimoto's disease   [facility] Reubin Milan, MD      Future Appointments            In 1 week Judithann Graves Nyoka Cowden, MD [facility]

## 2020-04-08 NOTE — Progress Notes (Deleted)
Date:  04/09/2020   Name:  Tamara Fox   DOB:  22-Dec-1965   MRN:  [id number]   Chief Complaint: No chief complaint on file.  Tamara Fox is a 54 y.o. female who presents today for her Complete Annual Exam. She feels {DESC; WELL/FAIRLY WELL/POORLY:18703}. She reports exercising ***. She reports she is sleeping {DESC; WELL/FAIRLY WELL/POORLY:18703}. Breast complaints ***.  Mammogram: 01/2020 DEXA: none Pap smear: 03/2018 net thin prep Colonoscopy: 04/2018  Immunization History  Administered Date(s) Administered  . Hep A / Hep B 06/30/1993  . Hepatitis B 06/30/1993  . Influenza,inj,Quad PF,6+ Mos 04/29/2017, 04/05/2019  . Influenza-Unspecified 04/30/2017, 03/11/2018  . MMR 06/30/1968  . PFIZER SARS-COV-2 Vaccination 07/14/2019, 08/09/2019  . Tdap 07/01/2007  . Varicella 06/30/1973  . Zoster Recombinat (Shingrix) 05/31/2019, 08/29/2019    Thyroid Problem Presents for follow-up visit. The symptoms have been stable.  Depression        This is a chronic problem.The problem is unchanged.  Past treatments include SSRIs - Selective serotonin reuptake inhibitors and other medications (buspar, bupropion. cymbalta).  Past medical history includes thyroid problem.     Lab Results  Component Value Date   CREATININE 0.88 04/05/2019   BUN 13 04/05/2019   NA 140 04/05/2019   K 4.4 04/05/2019   CL 102 04/05/2019   CO2 27 04/05/2019   Lab Results  Component Value Date   CHOL 207 (H) 04/05/2019   HDL 77 04/05/2019   LDLCALC 109 (H) 04/05/2019   TRIG 119 04/05/2019   CHOLHDL 2.7 04/05/2019   Lab Results  Component Value Date   TSH 3.660 04/05/2019   Lab Results  Component Value Date   HGBA1C 4.8 03/30/2018   Lab Results  Component Value Date   WBC 5.8 04/05/2019   HGB 13.8 04/05/2019   HCT 41.5 04/05/2019   MCV 90 04/05/2019   PLT 239 04/05/2019   Lab Results  Component Value Date   ALT 23 04/05/2019   AST 26 04/05/2019   ALKPHOS 84 04/05/2019   BILITOT 0.3  04/05/2019     Review of Systems  Psychiatric/Behavioral: Positive for depression.    Patient Active Problem List   Diagnosis Date Noted  . OSA (obstructive sleep apnea) 04/28/2019  . Multinodular goiter 04/05/2019  . Hypothyroidism due to Hashimoto's thyroiditis 04/05/2019  . High serum high density lipoprotein (HDL) 09/21/2017  . History of renal stone 09/21/2017  . BMI 31.0-31.9,adult 09/21/2017  . Major depression single episode, in partial remission ([place]) 02/05/2015    No Known Allergies  Past Surgical History:  Procedure Laterality Date  . BUNIONECTOMY  2013 2017   RightX 2  . COLONOSCOPY WITH PROPOFOL N/A 05/05/2018   Procedure: COLONOSCOPY WITH PROPOFOL;  Surgeon: Lin Landsman, MD;  Location: [facility];  Service: Endoscopy;  Laterality: N/A;  . LAPAROSCOPIC APPENDECTOMY N/A 05/05/2018   Procedure: APPENDECTOMY LAPAROSCOPIC;  Surgeon: Vickie Epley, MD;  Location: [facility];  Service: General;  Laterality: N/A;    Social History   Tobacco Use  . Smoking status: Former Smoker    Packs/day: 0.50    Years: 20.00    Pack years: 10.00    Quit date: 09/30/2002    Years since quitting: 17.5  . Smokeless tobacco: Never Used  Vaping Use  . Vaping Use: Never used  Substance Use Topics  . Alcohol use: Yes    Alcohol/week: 7.0 standard drinks    Types: 7 Glasses of wine per week    Comment:    .  Drug use: Never     Medication list has been reviewed and updated.  No outpatient medications have been marked as taking for the 04/09/20 encounter (Appointment) with Glean Hess, MD.    [facility] 2/9 Scores 07/19/2019 04/05/2019 03/30/2018  PHQ - 2 Score 0 0 0  PHQ- 9 Score 0 4 0    No flowsheet data found.  BP Readings from Last 3 Encounters:  07/19/19 120/72  04/05/19 122/88  05/20/18 130/75    Physical Exam  Wt Readings from Last 3 Encounters:  07/19/19 170 lb (77.1 kg)  04/05/19 166 lb (75.3 kg)  05/20/18 170 lb 9.6 oz (77.4 kg)     There were no vitals taken for this visit.  Assessment and Plan:

## 2020-04-09 ENCOUNTER — Encounter: Payer: Self-pay | Admitting: Internal Medicine

## 2020-04-09 DIAGNOSIS — Z1159 Encounter for screening for other viral diseases: Secondary | ICD-10-CM

## 2020-04-09 DIAGNOSIS — E038 Other specified hypothyroidism: Secondary | ICD-10-CM

## 2020-04-09 DIAGNOSIS — F324 Major depressive disorder, single episode, in partial remission: Secondary | ICD-10-CM

## 2020-04-09 DIAGNOSIS — Z0000 Encounter for general adult medical examination without abnormal findings: Secondary | ICD-10-CM

## 2020-04-10 ENCOUNTER — Ambulatory Visit (INDEPENDENT_AMBULATORY_CARE_PROVIDER_SITE_OTHER): Payer: 59 | Admitting: Internal Medicine

## 2020-04-10 ENCOUNTER — Other Ambulatory Visit: Payer: Self-pay

## 2020-04-10 ENCOUNTER — Encounter: Payer: Self-pay | Admitting: Internal Medicine

## 2020-04-10 VITALS — BP 126/80 | HR 81 | Temp 98.1°F | Ht 62.0 in | Wt 166.0 lb

## 2020-04-10 DIAGNOSIS — E063 Autoimmune thyroiditis: Secondary | ICD-10-CM

## 2020-04-10 DIAGNOSIS — E038 Other specified hypothyroidism: Secondary | ICD-10-CM | POA: Diagnosis not present

## 2020-04-10 DIAGNOSIS — Z0000 Encounter for general adult medical examination without abnormal findings: Secondary | ICD-10-CM

## 2020-04-10 DIAGNOSIS — F324 Major depressive disorder, single episode, in partial remission: Secondary | ICD-10-CM | POA: Diagnosis not present

## 2020-04-10 DIAGNOSIS — Z23 Encounter for immunization: Secondary | ICD-10-CM

## 2020-04-10 DIAGNOSIS — Z1159 Encounter for screening for other viral diseases: Secondary | ICD-10-CM | POA: Diagnosis not present

## 2020-04-10 LAB — POCT URINALYSIS DIPSTICK
Bilirubin, UA: NEGATIVE
Blood, UA: NEGATIVE
Glucose, UA: NEGATIVE
Ketones, UA: NEGATIVE
Leukocytes, UA: NEGATIVE
Nitrite, UA: NEGATIVE
Protein, UA: NEGATIVE
Spec Grav, UA: <=1.005 — AB (ref 1.010–1.025)
Urobilinogen, UA: 0.2 E.U./dL
pH, UA: 7 (ref 5.0–8.0)

## 2020-04-10 NOTE — Progress Notes (Signed)
Date:  04/10/2020   Name:  Tamara Fox   DOB:  11-19-1965   MRN:  [id number]   Chief Complaint: Annual Exam (breast exam no pap) and Flu Vaccine  Tamara Fox is a 54 y.o. female who presents today for her Complete Annual Exam. She feels well. She reports exercising none. She reports she is sleeping fairly well. Breast complaints none.  Mammogram: 01/2020 Pap smear: 03/2018 neg thin prep repeat 2022 Colonoscopy: 04/2018  Immunization History  Administered Date(s) Administered  . Hep A / Hep B 06/30/1993  . Hepatitis B 06/30/1993  . Influenza,inj,Quad PF,6+ Mos 04/29/2017, 04/05/2019  . Influenza-Unspecified 04/30/2017, 03/11/2018  . MMR 06/30/1968  . PFIZER SARS-COV-2 Vaccination 07/14/2019, 08/09/2019  . Tdap 11/12/2014  . Varicella 06/30/1973  . Zoster Recombinat (Shingrix) 05/31/2019, 08/29/2019    Thyroid Problem Presents for follow-up visit. Patient reports no anxiety, constipation, diarrhea, fatigue, palpitations or tremors. The symptoms have been stable.  Depression        This is a chronic (being treated and followed by Psychiatry) problem.The problem is unchanged.  Associated symptoms include no fatigue and no headaches.  Past treatments include SNRIs - Serotonin and norepinephrine reuptake inhibitors (plus bupropion and buspar).  Past medical history includes thyroid problem.     Lab Results  Component Value Date   CREATININE 0.88 04/05/2019   BUN 13 04/05/2019   NA 140 04/05/2019   K 4.4 04/05/2019   CL 102 04/05/2019   CO2 27 04/05/2019   Lab Results  Component Value Date   CHOL 207 (H) 04/05/2019   HDL 77 04/05/2019   LDLCALC 109 (H) 04/05/2019   TRIG 119 04/05/2019   CHOLHDL 2.7 04/05/2019   Lab Results  Component Value Date   TSH 3.660 04/05/2019   Lab Results  Component Value Date   HGBA1C 4.8 03/30/2018   Lab Results  Component Value Date   WBC 5.8 04/05/2019   HGB 13.8 04/05/2019   HCT 41.5 04/05/2019   MCV 90 04/05/2019   PLT 239  04/05/2019   Lab Results  Component Value Date   ALT 23 04/05/2019   AST 26 04/05/2019   ALKPHOS 84 04/05/2019   BILITOT 0.3 04/05/2019     Review of Systems  Constitutional: Negative for chills, fatigue, fever and unexpected weight change.  HENT: Negative for congestion, hearing loss, tinnitus, trouble swallowing and voice change.   Eyes: Negative for visual disturbance.  Respiratory: Negative for cough, chest tightness, shortness of breath and wheezing.   Cardiovascular: Negative for chest pain, palpitations and leg swelling.  Gastrointestinal: Negative for abdominal pain, constipation, diarrhea and vomiting.  Endocrine: Negative for polydipsia and polyuria.  Genitourinary: Negative for dysuria, frequency, genital sores, vaginal bleeding and vaginal discharge.  Musculoskeletal: Positive for arthralgias (wrists and knees). Negative for gait problem and joint swelling.  Skin: Negative for color change and rash.  Neurological: Negative for dizziness, tremors, light-headedness and headaches.  Hematological: Negative for adenopathy. Does not bruise/bleed easily.  Psychiatric/Behavioral: Positive for depression. Negative for dysphoric mood and sleep disturbance. The patient is not nervous/anxious.     Patient Active Problem List   Diagnosis Date Noted  . OSA (obstructive sleep apnea) 04/28/2019  . Multinodular goiter 04/05/2019  . Hypothyroidism due to Hashimoto's thyroiditis 04/05/2019  . High serum high density lipoprotein (HDL) 09/21/2017  . History of renal stone 09/21/2017  . BMI 31.0-31.9,adult 09/21/2017  . Major depression single episode, in partial remission ([place]) 02/05/2015    No Known Allergies  Past Surgical History:  Procedure Laterality Date  . BUNIONECTOMY  2013 2017   RightX 2  . COLONOSCOPY WITH PROPOFOL N/A 05/05/2018   Procedure: COLONOSCOPY WITH PROPOFOL;  Surgeon: Lin Landsman, MD;  Location: [facility];  Service: Endoscopy;  Laterality:  N/A;  . LAPAROSCOPIC APPENDECTOMY N/A 05/05/2018   Procedure: APPENDECTOMY LAPAROSCOPIC;  Surgeon: Vickie Epley, MD;  Location: [facility];  Service: General;  Laterality: N/A;    Social History   Tobacco Use  . Smoking status: Former Smoker    Packs/day: 0.50    Years: 20.00    Pack years: 10.00    Quit date: 09/30/2002    Years since quitting: 17.5  . Smokeless tobacco: Never Used  Vaping Use  . Vaping Use: Never used  Substance Use Topics  . Alcohol use: Yes    Alcohol/week: 7.0 standard drinks    Types: 7 Glasses of wine per week    Comment:    . Drug use: Never     Medication list has been reviewed and updated.  Current Meds  Medication Sig  . b complex vitamins capsule Take 1 capsule by mouth daily.  Marland Kitchen buPROPion (WELLBUTRIN XL) 150 MG 24 hr tablet TAKE 1 TABLET BY MOUTH EVERY DAY  . busPIRone (BUSPAR) 10 MG tablet TAKE 2 TABLETS (20 MG TOTAL) BY MOUTH 2 (TWO) TIMES DAILY.  . cholecalciferol 25 MCG (1000 UT) TABS Take 2,000 Units by mouth daily.   . DULoxetine (CYMBALTA) 60 MG capsule Take 1 capsule (60 mg total) by mouth daily.  . hydrOXYzine (ATARAX/VISTARIL) 50 MG tablet Take 1-2 tablets (50-100 mg total) by mouth at bedtime as needed (sleep).  Marland Kitchen levothyroxine (SYNTHROID) 125 MCG tablet TAKE 1 TABLET BY MOUTH EVERY DAY  . Magnesium 500 MG CAPS Take 750 mg by mouth daily.   . Omega-3 Fatty Acids (FISH OIL) 1000 MG CAPS Take 1,000 mg by mouth daily.   Marland Kitchen omeprazole (PRILOSEC) 10 MG capsule Take 10 mg by mouth daily.    PHQ 2/9 Scores 04/10/2020 07/19/2019 04/05/2019 03/30/2018  PHQ - 2 Score 0 0 0 0  PHQ- 9 Score 0 0 4 0    GAD 7 : Generalized Anxiety Score 04/10/2020  Nervous, Anxious, on Edge 0  Control/stop worrying 0  Worry too much - different things 0  Trouble relaxing 0  Restless 0  Easily annoyed or irritable 0  Afraid - awful might happen 0  Total GAD 7 Score 0  Anxiety Difficulty Not difficult at all    BP Readings from Last 3 Encounters:   04/10/20 126/80  07/19/19 120/72  04/05/19 122/88    Physical Exam Vitals and nursing note reviewed.  Constitutional:      General: She is not in acute distress.    Appearance: She is well-developed.  HENT:     Head: Normocephalic and atraumatic.     Right Ear: Tympanic membrane and ear canal normal.     Left Ear: Tympanic membrane and ear canal normal.     Nose:     Right Sinus: No maxillary sinus tenderness.     Left Sinus: No maxillary sinus tenderness.  Eyes:     General: No scleral icterus.       Right eye: No discharge.        Left eye: No discharge.     Conjunctiva/sclera: Conjunctivae normal.  Neck:     Thyroid: No thyromegaly.     Vascular: No carotid bruit.  Cardiovascular:  Rate and Rhythm: Normal rate and regular rhythm.     Pulses: Normal pulses.     Heart sounds: Normal heart sounds.  Pulmonary:     Effort: Pulmonary effort is normal. No respiratory distress.     Breath sounds: No wheezing.  Chest:     Breasts:        Right: No mass, nipple discharge, skin change or tenderness.        Left: No mass, nipple discharge, skin change or tenderness.  Abdominal:     General: Bowel sounds are normal.     Palpations: Abdomen is soft.     Tenderness: There is no abdominal tenderness.  Musculoskeletal:     Cervical back: Normal range of motion. No erythema.     Right lower leg: No edema.     Left lower leg: No edema.  Lymphadenopathy:     Cervical: No cervical adenopathy.  Skin:    General: Skin is warm and dry.     Findings: No rash.  Neurological:     General: No focal deficit present.     Mental Status: She is alert and oriented to person, place, and time.     Cranial Nerves: No cranial nerve deficit.     Sensory: No sensory deficit.     Deep Tendon Reflexes: Reflexes are normal and symmetric.  Psychiatric:        Attention and Perception: Attention normal.        Mood and Affect: Mood normal.     Wt Readings from Last 3 Encounters:  04/10/20  166 lb (75.3 kg)  07/19/19 170 lb (77.1 kg)  04/05/19 166 lb (75.3 kg)    BP 126/80 (BP Location: Right Arm, Patient Position: Sitting)   Pulse 81   Temp 98.1 F (36.7 C) (Oral)   Ht _0  (1.575 m)   Wt 166 lb (75.3 kg)   SpO2 99%   BMI 30.36 kg/m   Assessment and Plan: 1. Annual physical exam Exam is normal except for weight. Encourage regular exercise and appropriate dietary changes. - CBC with Differential/Platelet - Comprehensive metabolic panel - Lipid panel - POCT urinalysis dipstick  2. Need for hepatitis C screening test - Hepatitis C antibody  3. Hypothyroidism due to Hashimoto's thyroiditis Supplemented; check labs and advise on dose change - TSH + free T4  4. Major depression single episode, in partial remission (HCC) Clinically stable on current regimen with good control of symptoms, No SI or HI. Will continue to follow up with Psychiatry for medication management   Partially dictated using Dragon software. Any errors are unintentional.  Halina Maidens, MD [facility]  04/10/2020

## 2020-04-13 LAB — LIPID PANEL
Chol/HDL Ratio: 2.6 ratio (ref 0.0–4.4)
Cholesterol, Total: 195 mg/dL (ref 100–199)
HDL: 74 mg/dL (ref >39)
LDL Chol Calc (NIH): 106 mg/dL — ABNORMAL HIGH (ref 0–99)
Triglycerides: 85 mg/dL (ref 0–149)
VLDL Cholesterol Cal: 15 mg/dL (ref 5–40)

## 2020-04-13 LAB — CBC WITH DIFFERENTIAL/PLATELET
Basophils Absolute: 0 10*3/uL (ref 0.0–0.2)
Basos: 1 %
EOS (ABSOLUTE): 0.1 10*3/uL (ref 0.0–0.4)
Eos: 2 %
Hematocrit: 38.5 % (ref 34.0–46.6)
Hemoglobin: 12.9 g/dL (ref 11.1–15.9)
Immature Grans (Abs): 0 10*3/uL (ref 0.0–0.1)
Immature Granulocytes: 0 %
Lymphocytes Absolute: 1.9 10*3/uL (ref 0.7–3.1)
Lymphs: 36 %
MCH: 30.4 pg (ref 26.6–33.0)
MCHC: 33.5 g/dL (ref 31.5–35.7)
MCV: 91 fL (ref 79–97)
Monocytes Absolute: 0.6 10*3/uL (ref 0.1–0.9)
Monocytes: 11 %
Neutrophils Absolute: 2.7 10*3/uL (ref 1.4–7.0)
Neutrophils: 50 %
Platelets: 254 10*3/uL (ref 150–450)
RBC: 4.25 x10E6/uL (ref 3.77–5.28)
RDW: 13 % (ref 11.7–15.4)
WBC: 5.4 10*3/uL (ref 3.4–10.8)

## 2020-04-13 LAB — COMPREHENSIVE METABOLIC PANEL
ALT: 15 IU/L (ref 0–32)
AST: 19 IU/L (ref 0–40)
Albumin/Globulin Ratio: 2 (ref 1.2–2.2)
Albumin: 4.3 g/dL (ref 3.8–4.9)
Alkaline Phosphatase: 86 IU/L (ref 44–121)
BUN/Creatinine Ratio: 13 (ref 9–23)
BUN: 9 mg/dL (ref 6–24)
Bilirubin Total: 0.4 mg/dL (ref 0.0–1.2)
CO2: 27 mmol/L (ref 20–29)
Calcium: 9.5 mg/dL (ref 8.7–10.2)
Chloride: 102 mmol/L (ref 96–106)
Creatinine, Ser: 0.72 mg/dL (ref 0.57–1.00)
GFR calc Af Amer: 111 mL/min/{1.73_m2} (ref >59)
GFR calc non Af Amer: 97 mL/min/{1.73_m2} (ref >59)
Globulin, Total: 2.2 g/dL (ref 1.5–4.5)
Glucose: 80 mg/dL (ref 65–99)
Potassium: 4.3 mmol/L (ref 3.5–5.2)
Sodium: 139 mmol/L (ref 134–144)
Total Protein: 6.5 g/dL (ref 6.0–8.5)

## 2020-04-13 LAB — TSH+FREE T4
Free T4: 1.08 ng/dL (ref 0.82–1.77)
TSH: 1.14 u[IU]/mL (ref 0.450–4.500)

## 2020-04-13 LAB — HEPATITIS C ANTIBODY: Hep C Virus Ab: <0.1 s/co ratio (ref 0.0–0.9)

## 2020-04-14 ENCOUNTER — Other Ambulatory Visit: Payer: Self-pay | Admitting: Internal Medicine

## 2020-04-14 DIAGNOSIS — F324 Major depressive disorder, single episode, in partial remission: Secondary | ICD-10-CM

## 2020-04-14 NOTE — Telephone Encounter (Signed)
Requested Prescriptions  Pending Prescriptions Disp Refills  . hydrOXYzine (ATARAX/VISTARIL) 50 MG tablet [Pharmacy Med Name: HYDROXYZINE HCL 50 MG TABLET] 30 tablet 0    Sig: TAKE 1 TABLET BY MOUTH EVERY DAY AT NIGHT     Ear, Nose, and Throat:  Antihistamines Passed - 04/14/2020  8:47 AM      Passed - Valid encounter within last 12 months    Recent Outpatient Visits          4 days ago Annual physical exam   [facility] Reubin Milan, MD   9 months ago OSA (obstructive sleep apnea)   [facility] Reubin Milan, MD   1 year ago Annual physical exam   [facility] Reubin Milan, MD   2 years ago Annual physical exam   [facility] Reubin Milan, MD   2 years ago Hashimoto's disease   [facility] Reubin Milan, MD      Future Appointments            In 12 months Judithann Graves Nyoka Cowden, MD [facility]

## 2020-10-05 ENCOUNTER — Other Ambulatory Visit: Payer: Self-pay | Admitting: Internal Medicine

## 2020-10-05 DIAGNOSIS — E038 Other specified hypothyroidism: Secondary | ICD-10-CM

## 2020-10-05 NOTE — Telephone Encounter (Signed)
Requested Prescriptions  Pending Prescriptions Disp Refills  . levothyroxine (SYNTHROID) 125 MCG tablet [Pharmacy Med Name: LEVOTHYROXINE 125 MCG TABLET] 90 tablet 1    Sig: TAKE 1 TABLET BY MOUTH EVERY DAY     Endocrinology:  Hypothyroid Agents Failed - 10/05/2020  1:20 AM      Failed - TSH needs to be rechecked within 3 months after an abnormal result. Refill until TSH is due.      Passed - TSH in normal range and within 360 days    TSH  Date Value Ref Range Status  04/12/2020 1.140 0.450 - 4.500 uIU/mL Final         Passed - Valid encounter within last 12 months    Recent Outpatient Visits          5 months ago Annual physical exam   [facility] Reubin Milan, MD   1 year ago OSA (obstructive sleep apnea)   [facility] Reubin Milan, MD   1 year ago Annual physical exam   [facility] Reubin Milan, MD   2 years ago Annual physical exam   [facility] Reubin Milan, MD   3 years ago Hashimoto's disease   [facility] Reubin Milan, MD      Future Appointments            In 6 months Judithann Graves Nyoka Cowden, MD [facility]

## 2021-04-12 ENCOUNTER — Encounter: Payer: Self-pay | Admitting: Internal Medicine

## 2021-04-12 ENCOUNTER — Other Ambulatory Visit: Payer: Self-pay

## 2021-04-12 ENCOUNTER — Ambulatory Visit (INDEPENDENT_AMBULATORY_CARE_PROVIDER_SITE_OTHER): Payer: 59 | Admitting: Internal Medicine

## 2021-04-12 ENCOUNTER — Other Ambulatory Visit (HOSPITAL_COMMUNITY)
Admission: RE | Admit: 2021-04-12 | Discharge: 2021-04-12 | Disposition: A | Discharge status: Home / Self Care | Payer: 59 | Source: Ambulatory Visit | Attending: Internal Medicine | Admitting: Internal Medicine

## 2021-04-12 VITALS — BP 132/82 | HR 67 | Ht 62.0 in | Wt 171.0 lb

## 2021-04-12 DIAGNOSIS — F324 Major depressive disorder, single episode, in partial remission: Secondary | ICD-10-CM

## 2021-04-12 DIAGNOSIS — Z0000 Encounter for general adult medical examination without abnormal findings: Secondary | ICD-10-CM | POA: Diagnosis not present

## 2021-04-12 DIAGNOSIS — Z124 Encounter for screening for malignant neoplasm of cervix: Secondary | ICD-10-CM | POA: Diagnosis present

## 2021-04-12 DIAGNOSIS — E038 Other specified hypothyroidism: Secondary | ICD-10-CM

## 2021-04-12 DIAGNOSIS — R7989 Other specified abnormal findings of blood chemistry: Secondary | ICD-10-CM

## 2021-04-12 DIAGNOSIS — G4733 Obstructive sleep apnea (adult) (pediatric): Secondary | ICD-10-CM | POA: Diagnosis not present

## 2021-04-12 DIAGNOSIS — M542 Cervicalgia: Secondary | ICD-10-CM | POA: Diagnosis not present

## 2021-04-12 DIAGNOSIS — M25512 Pain in left shoulder: Secondary | ICD-10-CM | POA: Diagnosis not present

## 2021-04-12 DIAGNOSIS — G8929 Other chronic pain: Secondary | ICD-10-CM

## 2021-04-12 DIAGNOSIS — E063 Autoimmune thyroiditis: Secondary | ICD-10-CM

## 2021-04-12 MED ORDER — CYCLOBENZAPRINE HCL 10 MG PO TABS: 10.0000 mg | ORAL_TABLET | Freq: Every day | ORAL | 0 refills | Status: AC

## 2021-04-12 NOTE — Progress Notes (Signed)
Date:  04/12/2021   Name:  Tamara Fox   DOB:  [birth year]   MRN:  [id number]   Chief Complaint: Annual Exam (Breast Exam. Papsmear. ) Tamara Fox is a 55 y.o. female who presents today for her Complete Annual Exam. She feels well. She reports exercising walking everyday. She reports she is sleeping well. Breast complaints - none.  Mammogram: 01/2021 DEXA: none Pap smear: 03/2018 thin prep Colonoscopy: 04/2018 repeat 10 yrs  Immunization History  Administered Date(s) Administered   Hep A / Hep B 06/30/1993   Hepatitis B 06/30/1993   Influenza,inj,Quad PF,6+ Mos 04/29/2017, 04/05/2019, 04/10/2020   Influenza-Unspecified 04/30/2017, 03/11/2018   MMR 06/30/1968   PFIZER(Purple Top)SARS-COV-2 Vaccination 07/14/2019, 08/09/2019, 04/16/2020   Tdap 11/12/2014   Varicella 06/30/1973   Zoster Recombinat (Shingrix) 05/31/2019, 08/29/2019    Thyroid Problem Presents for follow-up visit. Patient reports no anxiety, constipation, diarrhea, fatigue, palpitations or tremors. The symptoms have been stable.  Neck Pain  This is a new problem. The current episode started more than 1 month ago. The pain is associated with nothing. The quality of the pain is described as aching and cramping. Associated symptoms include numbness (tingling in fingers at times). Pertinent negatives include no chest pain, fever, headaches or trouble swallowing. Associated symptoms comments: And shoulder pain. She has tried NSAIDs for the symptoms.  Shoulder Pain  The pain is present in the left shoulder. This is a new problem. The current episode started more than 1 month ago. There has been no history of extremity trauma. The problem occurs daily. The problem has been unchanged. The quality of the pain is described as aching. Associated symptoms include numbness (tingling in fingers at times). Pertinent negatives include no fever. She has tried NSAIDS for the symptoms. The treatment provided no relief.  Depression         This is a chronic problem.The problem is unchanged.  Associated symptoms include no fatigue and no headaches.  Past treatments include SNRIs - Serotonin and norepinephrine reuptake inhibitors and other medications.  Compliance with treatment is good.  Previous treatment provided significant relief.  Past medical history includes thyroid problem.   OSA - on nightly CPAP. Lab Results  Component Value Date   CREATININE 0.72 04/12/2020   BUN 9 04/12/2020   NA 139 04/12/2020   K 4.3 04/12/2020   CL 102 04/12/2020   CO2 27 04/12/2020   Lab Results  Component Value Date   CHOL 195 04/12/2020   HDL 74 04/12/2020   LDLCALC 106 (H) 04/12/2020   TRIG 85 04/12/2020   CHOLHDL 2.6 04/12/2020   Lab Results  Component Value Date   TSH 1.140 04/12/2020   Lab Results  Component Value Date   HGBA1C 4.8 03/30/2018   Lab Results  Component Value Date   WBC 5.4 04/12/2020   HGB 12.9 04/12/2020   HCT 38.5 04/12/2020   MCV 91 04/12/2020   PLT 254 04/12/2020   Lab Results  Component Value Date   ALT 15 04/12/2020   AST 19 04/12/2020   ALKPHOS 86 04/12/2020   BILITOT 0.4 04/12/2020     Review of Systems  Constitutional:  Negative for chills, fatigue and fever.  HENT:  Negative for congestion, hearing loss, tinnitus, trouble swallowing and voice change.   Eyes:  Negative for visual disturbance.  Respiratory:  Negative for cough, chest tightness, shortness of breath and wheezing.   Cardiovascular:  Negative for chest pain, palpitations and leg swelling.  Gastrointestinal:  Negative  for abdominal pain, constipation, diarrhea and vomiting.  Endocrine: Negative for polydipsia and polyuria.  Genitourinary:  Negative for dysuria, frequency, genital sores, vaginal bleeding and vaginal discharge.  Musculoskeletal:  Positive for neck pain. Negative for arthralgias, gait problem and joint swelling.  Skin:  Negative for color change and rash.  Neurological:  Positive for numbness (tingling in  fingers at times). Negative for dizziness, tremors, light-headedness and headaches.  Hematological:  Negative for adenopathy. Does not bruise/bleed easily.  Psychiatric/Behavioral:  Positive for depression. Negative for dysphoric mood and sleep disturbance. The patient is not nervous/anxious.    Patient Active Problem List   Diagnosis Date Noted   OSA (obstructive sleep apnea) 04/28/2019   Multinodular goiter 04/05/2019   Hypothyroidism due to Hashimoto's thyroiditis 04/05/2019   High serum high density lipoprotein (HDL) 09/21/2017   History of renal stone 09/21/2017   BMI 31.0-31.9,adult 09/21/2017   Major depression single episode, in partial remission ([place]) 02/05/2015    No Known Allergies  Past Surgical History:  Procedure Laterality Date   BUNIONECTOMY  2013 2017   RightX 2   COLONOSCOPY WITH PROPOFOL N/A 05/05/2018   Procedure: COLONOSCOPY WITH PROPOFOL;  Surgeon: Lin Landsman, MD;  Location: [place];  Service: Endoscopy;  Laterality: N/A;   LAPAROSCOPIC APPENDECTOMY N/A 05/05/2018   Procedure: APPENDECTOMY LAPAROSCOPIC;  Surgeon: Vickie Epley, MD;  Location: [facility];  Service: General;  Laterality: N/A;    Social History   Tobacco Use   Smoking status: Former    Packs/day: 0.50    Years: 20.00    Pack years: 10.00    Types: Cigarettes    Quit date: 09/30/2002    Years since quitting: 18.5   Smokeless tobacco: Never  Vaping Use   Vaping Use: Never used  Substance Use Topics   Alcohol use: Yes    Alcohol/week: 7.0 standard drinks    Types: 7 Glasses of wine per week    Comment:     Drug use: Never     Medication list has been reviewed and updated.  Current Meds  Medication Sig   b complex vitamins capsule Take 1 capsule by mouth daily.   busPIRone (BUSPAR) 10 MG tablet Take 20 mg by mouth 2 (two) times daily.   cholecalciferol 25 MCG (1000 UT) TABS Take 2,000 Units by mouth daily.    cyclobenzaprine (FLEXERIL) 10 MG tablet Take 1  tablet (10 mg total) by mouth at bedtime.   DULoxetine (CYMBALTA) 60 MG capsule Take 60 mg by mouth daily.   hydrOXYzine (ATARAX/VISTARIL) 50 MG tablet TAKE 1 TABLET BY MOUTH EVERY DAY AT NIGHT   levothyroxine (SYNTHROID) 125 MCG tablet TAKE 1 TABLET BY MOUTH EVERY DAY   Magnesium 500 MG CAPS Take 750 mg by mouth daily.    NON FORMULARY CPAP noight   Omega-3 Fatty Acids (FISH OIL) 1000 MG CAPS Take 1,000 mg by mouth daily.    omeprazole (PRILOSEC) 10 MG capsule Take 10 mg by mouth daily.   [DISCONTINUED] busPIRone (BUSPAR) 10 MG tablet TAKE 2 TABLETS (20 MG TOTAL) BY MOUTH 2 (TWO) TIMES DAILY. (Patient taking differently: Take 20 mg by mouth 2 (two) times daily. Prescribed by Psych.)   [DISCONTINUED] DULoxetine (CYMBALTA) 60 MG capsule Take 1 capsule (60 mg total) by mouth daily. (Patient taking differently: Take 60 mg by mouth daily. Prescribed by psych.)    PHQ 2/9 Scores 04/12/2021 04/10/2020 07/19/2019 04/05/2019  PHQ - 2 Score 0 0 0 0  PHQ- 9 Score  0 0 0 4    GAD 7 : Generalized Anxiety Score 04/12/2021 04/10/2020  Nervous, Anxious, on Edge 0 0  Control/stop worrying 0 0  Worry too much - different things 0 0  Trouble relaxing 0 0  Restless 0 0  Easily annoyed or irritable 0 0  Afraid - awful might happen 0 0  Total GAD 7 Score 0 0  Anxiety Difficulty Not difficult at all Not difficult at all    BP Readings from Last 3 Encounters:  04/12/21 132/82  04/10/20 126/80  07/19/19 120/72    Physical Exam Vitals and nursing note reviewed.  Constitutional:      General: She is not in acute distress.    Appearance: She is well-developed.  HENT:     Head: Normocephalic and atraumatic.     Right Ear: Tympanic membrane and ear canal normal.     Left Ear: Tympanic membrane and ear canal normal.     Nose:     Right Sinus: No maxillary sinus tenderness.     Left Sinus: No maxillary sinus tenderness.  Eyes:     General: No scleral icterus.       Right eye: No discharge.         Left eye: No discharge.     Conjunctiva/sclera: Conjunctivae normal.  Neck:     Thyroid: No thyromegaly.     Vascular: No carotid bruit.  Cardiovascular:     Rate and Rhythm: Normal rate and regular rhythm.     Pulses: Normal pulses.     Heart sounds: Normal heart sounds.  Pulmonary:     Effort: Pulmonary effort is normal. No respiratory distress.     Breath sounds: No wheezing.  Chest:  Breasts:    Right: No mass, nipple discharge, skin change or tenderness.     Left: No mass, nipple discharge, skin change or tenderness.  Abdominal:     General: Bowel sounds are normal.     Palpations: Abdomen is soft.     Tenderness: There is no abdominal tenderness.  Genitourinary:    Labia:        Right: No tenderness, lesion or injury.        Left: No tenderness, lesion or injury.      Vagina: Normal.     Cervix: No erythema.     Uterus: Normal.      Adnexa: Right adnexa normal and left adnexa normal.     Comments: 3 mm endocervical polyp with scant bleeding after pap Musculoskeletal:     Right shoulder: No tenderness. Normal range of motion. Normal strength. Normal pulse.     Left shoulder: Tenderness (over anterior joint) and crepitus present. Decreased range of motion. Normal strength. Normal pulse.     Cervical back: Normal range of motion. Spasms and tenderness present. No erythema. Normal range of motion.     Right lower leg: No edema.     Left lower leg: No edema.  Lymphadenopathy:     Cervical: No cervical adenopathy.  Skin:    General: Skin is warm and dry.     Findings: No rash.  Neurological:     Mental Status: She is alert and oriented to person, place, and time.     Cranial Nerves: No cranial nerve deficit.     Sensory: No sensory deficit.     Deep Tendon Reflexes: Reflexes are normal and symmetric.  Psychiatric:        Attention and Perception: Attention normal.  Mood and Affect: Mood normal.    Wt Readings from Last 3 Encounters:  04/12/21 171 lb (77.6 kg)   04/10/20 166 lb (75.3 kg)  07/19/19 170 lb (77.1 kg)    BP 132/82   Pulse 67   Ht _0  (1.575 m)   Wt 171 lb (77.6 kg)   SpO2 98%   BMI 31.28 kg/m   Assessment and Plan: 1. Annual physical exam Normal exam Continue healthy diet, exercise - CBC with Differential/Platelet - Comprehensive metabolic panel  2. Encounter for screening for cervical cancer Obtained today - Cytology - PAP  3. Hypothyroidism due to Hashimoto's thyroiditis Supplemented - adjust dose if needed - TSH + free T4  4. OSA (obstructive sleep apnea) On CPAP nightly with good compliance  5. High serum high density lipoprotein (HDL) Check labs and advise - Lipid panel  6. Chronic left shoulder pain Will get plain films at [facility] and advise on next steps Take flexeril qhs along with Aleve. - cyclobenzaprine (FLEXERIL) 10 MG tablet; Take 1 tablet (10 mg total) by mouth at bedtime.  Dispense: 30 tablet; Refill: 0  7. Neck pain, chronic Will get plain films at [facility] Has seen chiropractor with some benefit. Muscle relaxant and nsaid recommended  8. Major depression single episode, in partial remission ([place]) Doing well on Cymbalta and Buspar - prescribed by Psych   Partially dictated using [employer]. Any errors are unintentional.  Halina Maidens, MD [facility]  04/12/2021

## 2021-04-16 LAB — CYTOLOGY - PAP
Comment: NEGATIVE
Diagnosis: NEGATIVE
Diagnosis: REACTIVE
High risk HPV: NEGATIVE

## 2021-04-18 ENCOUNTER — Encounter: Payer: Self-pay | Admitting: Internal Medicine

## 2021-04-18 LAB — CBC WITH DIFFERENTIAL/PLATELET
Basophils Absolute: 0 10*3/uL (ref 0.0–0.2)
Basos: 1 %
EOS (ABSOLUTE): 0.1 10*3/uL (ref 0.0–0.4)
Eos: 2 %
Hematocrit: 38.8 % (ref 34.0–46.6)
Hemoglobin: 12.9 g/dL (ref 11.1–15.9)
Immature Grans (Abs): 0 10*3/uL (ref 0.0–0.1)
Immature Granulocytes: 0 %
Lymphocytes Absolute: 1.9 10*3/uL (ref 0.7–3.1)
Lymphs: 40 %
MCH: 30.7 pg (ref 26.6–33.0)
MCHC: 33.2 g/dL (ref 31.5–35.7)
MCV: 92 fL (ref 79–97)
Monocytes Absolute: 0.5 10*3/uL (ref 0.1–0.9)
Monocytes: 9 %
Neutrophils Absolute: 2.3 10*3/uL (ref 1.4–7.0)
Neutrophils: 48 %
Platelets: 234 10*3/uL (ref 150–450)
RBC: 4.2 x10E6/uL (ref 3.77–5.28)
RDW: 12.6 % (ref 11.7–15.4)
WBC: 4.8 10*3/uL (ref 3.4–10.8)

## 2021-04-18 LAB — LIPID PANEL
Chol/HDL Ratio: 2.5 ratio (ref 0.0–4.4)
Cholesterol, Total: 188 mg/dL (ref 100–199)
HDL: 75 mg/dL (ref >39)
LDL Chol Calc (NIH): 99 mg/dL (ref 0–99)
Triglycerides: 76 mg/dL (ref 0–149)
VLDL Cholesterol Cal: 14 mg/dL (ref 5–40)

## 2021-04-18 LAB — COMPREHENSIVE METABOLIC PANEL
ALT: 15 IU/L (ref 0–32)
AST: 20 IU/L (ref 0–40)
Albumin/Globulin Ratio: 2.6 — ABNORMAL HIGH (ref 1.2–2.2)
Albumin: 4.6 g/dL (ref 3.8–4.9)
Alkaline Phosphatase: 77 IU/L (ref 44–121)
BUN/Creatinine Ratio: 29 — ABNORMAL HIGH (ref 9–23)
BUN: 18 mg/dL (ref 6–24)
Bilirubin Total: 0.4 mg/dL (ref 0.0–1.2)
CO2: 24 mmol/L (ref 20–29)
Calcium: 9.1 mg/dL (ref 8.7–10.2)
Chloride: 104 mmol/L (ref 96–106)
Creatinine, Ser: 0.63 mg/dL (ref 0.57–1.00)
Globulin, Total: 1.8 g/dL (ref 1.5–4.5)
Glucose: 86 mg/dL (ref 70–99)
Potassium: 4.2 mmol/L (ref 3.5–5.2)
Sodium: 141 mmol/L (ref 134–144)
Total Protein: 6.4 g/dL (ref 6.0–8.5)
eGFR: 106 mL/min/{1.73_m2} (ref >59)

## 2021-04-18 LAB — TSH+FREE T4
Free T4: 1.21 ng/dL (ref 0.82–1.77)
TSH: 0.189 u[IU]/mL — ABNORMAL LOW (ref 0.450–4.500)

## 2021-04-19 ENCOUNTER — Other Ambulatory Visit: Payer: Self-pay | Admitting: Internal Medicine

## 2021-04-19 ENCOUNTER — Encounter: Payer: Self-pay | Admitting: Internal Medicine

## 2021-04-19 ENCOUNTER — Other Ambulatory Visit: Payer: Self-pay

## 2021-04-19 DIAGNOSIS — E038 Other specified hypothyroidism: Secondary | ICD-10-CM

## 2021-04-19 DIAGNOSIS — E063 Autoimmune thyroiditis: Secondary | ICD-10-CM

## 2021-04-19 MED ORDER — LEVOTHYROXINE SODIUM 125 MCG PO TABS: 125.0000 ug | ORAL_TABLET | Freq: Every day | ORAL | 1 refills | Status: DC

## 2021-04-19 MED ORDER — LEVOTHYROXINE SODIUM 125 MCG PO TABS
125.0000 ug | ORAL_TABLET | Freq: Every day | ORAL | 1 refills | Status: AC
Start: 2021-04-19 — End: ?

## 2021-04-23 NOTE — Progress Notes (Signed)
My Chart message sent to patient with results attached.

## 2021-11-22 ENCOUNTER — Other Ambulatory Visit: Payer: Self-pay | Admitting: Internal Medicine

## 2021-11-22 DIAGNOSIS — E038 Other specified hypothyroidism: Secondary | ICD-10-CM

## 2021-11-26 NOTE — Telephone Encounter (Signed)
Requested medications are due for refill today.  yes  Requested medications are on the active medications list.  yes  Last refill. 10/212022 #90 1 refill  Future visit scheduled.   no  Notes to clinic.  Medication refill failed protocol d/t abnormal labs.    Requested Prescriptions  Pending Prescriptions Disp Refills   levothyroxine (SYNTHROID) 125 MCG tablet [Pharmacy Med Name: L-THYROXINE (SYNTHROID) TABS 125MCG] 90 tablet 3    Sig: TAKE 1 TABLET DAILY     Endocrinology:  Hypothyroid Agents Failed - 11/22/2021  1:05 AM      Failed - TSH in normal range and within 360 days    TSH  Date Value Ref Range Status  04/17/2021 0.189 (L) 0.450 - 4.500 uIU/mL Final         Passed - Valid encounter within last 12 months    Recent Outpatient Visits           7 months ago Annual physical exam   [facility] Glean Hess, MD   1 year ago Annual physical exam   [facility] Glean Hess, MD   2 years ago OSA (obstructive sleep apnea)   [facility] Glean Hess, MD   2 years ago Annual physical exam   [facility] Glean Hess, MD   3 years ago Annual physical exam   [facility] Glean Hess, MD

## 2022-04-10 ENCOUNTER — Other Ambulatory Visit: Payer: Self-pay | Admitting: Internal Medicine

## 2022-04-10 DIAGNOSIS — E038 Other specified hypothyroidism: Secondary | ICD-10-CM

## 2022-04-10 NOTE — Telephone Encounter (Signed)
Requested medications are due for refill today.  yes  Requested medications are on the active medications list.  yes  Last refill. 04/19/2021 #90 1 rf  Future visit scheduled.   no  Notes to clinic.  Abnormal labs. PT needs an appt.    Requested Prescriptions  Pending Prescriptions Disp Refills   levothyroxine (SYNTHROID) 125 MCG tablet [Pharmacy Med Name: LEVOTHYROXINE 125 MCG TABLET] 30 tablet 5    Sig: TAKE 1 TABLET BY MOUTH EVERY DAY     Endocrinology:  Hypothyroid Agents Failed - 04/10/2022  3:20 AM      Failed - TSH in normal range and within 360 days    TSH  Date Value Ref Range Status  04/17/2021 0.189 (L) 0.450 - 4.500 uIU/mL Final         Failed - Valid encounter within last 12 months    Recent Outpatient Visits           12 months ago Annual physical exam   [place] [facility], Jesse Sans, MD   2 years ago Annual physical exam   [place] [facility] at [facility], Jesse Sans, MD   2 years ago OSA (obstructive sleep apnea)   [place] [facility] at [facility], Jesse Sans, MD   3 years ago Annual physical exam   [facility] at [facility], Jesse Sans, MD   4 years ago Annual physical exam   [facility] at [facility], Jesse Sans, MD

## 2022-04-11 NOTE — Telephone Encounter (Signed)
Left voice mail to set up medication refill appointment °
